# Patient Record
Sex: Female | Born: 1975 | Race: White | Hispanic: No | Marital: Single | Smoking: Never smoker
Health system: Southern US, Community
[De-identification: ages and names within clinical notes are randomized; demographics above are authoritative.]

## PROBLEM LIST (undated history)

## (undated) DIAGNOSIS — G4730 Sleep apnea, unspecified: Secondary | ICD-10-CM

## (undated) DIAGNOSIS — L409 Psoriasis, unspecified: Secondary | ICD-10-CM

## (undated) DIAGNOSIS — F410 Panic disorder [episodic paroxysmal anxiety] without agoraphobia: Secondary | ICD-10-CM

## (undated) DIAGNOSIS — M542 Cervicalgia: Secondary | ICD-10-CM

## (undated) DIAGNOSIS — J45909 Unspecified asthma, uncomplicated: Secondary | ICD-10-CM

## (undated) DIAGNOSIS — R030 Elevated blood-pressure reading, without diagnosis of hypertension: Secondary | ICD-10-CM

## (undated) DIAGNOSIS — G44209 Tension-type headache, unspecified, not intractable: Secondary | ICD-10-CM

## (undated) DIAGNOSIS — G43909 Migraine, unspecified, not intractable, without status migrainosus: Secondary | ICD-10-CM

## (undated) HISTORY — DX: Sleep apnea, unspecified: G47.30

## (undated) HISTORY — DX: Panic disorder (episodic paroxysmal anxiety): F41.0

## (undated) HISTORY — DX: Cervicalgia: M54.2

## (undated) HISTORY — DX: Elevated blood-pressure reading, without diagnosis of hypertension: R03.0

## (undated) HISTORY — DX: Migraine, unspecified, not intractable, without status migrainosus: G43.909

## (undated) HISTORY — DX: Tension-type headache, unspecified, not intractable: G44.209

## (undated) HISTORY — DX: Unspecified asthma, uncomplicated: J45.909

---

## 2007-11-02 ENCOUNTER — Observation Stay: Payer: Self-pay

## 2007-11-09 ENCOUNTER — Observation Stay: Payer: Self-pay | Admitting: Obstetrics and Gynecology

## 2007-11-11 ENCOUNTER — Observation Stay: Payer: Self-pay

## 2007-11-11 ENCOUNTER — Ambulatory Visit: Payer: Self-pay | Admitting: Obstetrics & Gynecology

## 2007-11-12 ENCOUNTER — Inpatient Hospital Stay: Payer: Self-pay

## 2009-12-01 ENCOUNTER — Ambulatory Visit: Payer: Self-pay | Admitting: Family Medicine

## 2010-07-19 ENCOUNTER — Emergency Department: Payer: Self-pay | Admitting: Unknown Physician Specialty

## 2010-10-09 ENCOUNTER — Ambulatory Visit: Payer: Self-pay | Admitting: Surgery

## 2010-12-12 ENCOUNTER — Ambulatory Visit: Payer: Self-pay | Admitting: Surgery

## 2011-01-08 ENCOUNTER — Ambulatory Visit: Payer: Self-pay | Admitting: Surgery

## 2011-04-10 ENCOUNTER — Ambulatory Visit: Payer: Self-pay | Admitting: Surgery

## 2011-05-11 ENCOUNTER — Ambulatory Visit: Payer: Self-pay | Admitting: Surgery

## 2011-06-18 ENCOUNTER — Ambulatory Visit: Payer: Self-pay | Admitting: Family Medicine

## 2011-06-20 ENCOUNTER — Encounter: Payer: Self-pay | Admitting: Family Medicine

## 2011-06-20 ENCOUNTER — Ambulatory Visit (INDEPENDENT_AMBULATORY_CARE_PROVIDER_SITE_OTHER): Payer: Commercial Managed Care - PPO | Admitting: Family Medicine

## 2011-06-20 VITALS — BP 112/80 | HR 84 | Temp 98.4°F | Ht 64.5 in | Wt 233.5 lb

## 2011-06-20 DIAGNOSIS — Z0189 Encounter for other specified special examinations: Secondary | ICD-10-CM

## 2011-06-20 DIAGNOSIS — F410 Panic disorder [episodic paroxysmal anxiety] without agoraphobia: Secondary | ICD-10-CM

## 2011-06-20 DIAGNOSIS — Z7689 Persons encountering health services in other specified circumstances: Secondary | ICD-10-CM

## 2011-06-20 DIAGNOSIS — G44209 Tension-type headache, unspecified, not intractable: Secondary | ICD-10-CM | POA: Insufficient documentation

## 2011-06-20 LAB — POCT URINE PREGNANCY: Preg Test, Ur: NEGATIVE

## 2011-06-20 MED ORDER — BACLOFEN 10 MG PO TABS: 10.0000 mg | ORAL_TABLET | Freq: Two times a day (BID) | ORAL | Status: AC | PRN

## 2011-06-20 MED ORDER — ALPRAZOLAM 0.25 MG PO TABS: 0.2500 mg | ORAL_TABLET | Freq: Every evening | ORAL | Status: AC | PRN

## 2011-06-20 NOTE — Assessment & Plan Note (Signed)
Coming from neck tension. States has not had effect with several relaxants including flexeril, methocarbamol.  Baclofen has worked well in past, will start this.

## 2011-06-20 NOTE — Patient Instructions (Addendum)
I will request records from [facility] on blood work and EKG done there and review. These sound like anxiety attacks. We will do trial of xanax to take on an as needed basis. Work on healthy ways of coping with stress. Please return if not improving. Return in 1-2 months for follow up.

## 2011-06-20 NOTE — Assessment & Plan Note (Addendum)
Reasonable to do trial xanax. Discussed temporary use of xanax, prn use.  Discussed tolerance and abusive potential of med. Palpitation episodes do sound consistent with panic attacks as improved with xanax, recent stress, age.  No fmhx arrhythmias, no LOC. Discussed I wanted to obtain blood work, EKG to work up palpitations, pt states had recent EKG and blood work and requests we start with reviewing those so have requested records. Advised if palpitations not improving on xanax will need to return. If increasing use of xanax, will need to discuss better long term management (SSRI, buspar, counseling). Discussed increased healthy coping mechanisms, pt thinks would like to start walking.  As starting xanax, Upreg today = neg.

## 2011-06-20 NOTE — Progress Notes (Signed)
Subjective:    Patient ID: Makayla Duke, female    DOB: 07/31/1976, 35 y.o.   MRN: [id number]  HPI CC: new pt, establish  Tends to get muscle spasm in neck, then causes HA.  Has gone on since child.  Has tried flexeril, methocarbamol, statss vicodin and soma and baclofen have worked well in past.  Has been told has high blood pressure in past.  Today stable.   Looked into sleeve gastrectomy.  H/o sleep apnea.  Not approved for surgery.  Panic attacks? - going on for years.  Starts with feeling nervous, worse with stress.  Heart speeds up and has felt some skipped beats, feels very nervous and scared to go outside.  Last 2 years worsening.  Recently (in last 34mo) have been getting progressively worse, getting dizzy with them, along with shortness of breath/hyperventilating and starts crying.  Had one last night while driving.  Last 30-40 min.  Now getting 3-4 days /wk.  Denies chest pain/tightness, abd pain, nausea/vomiting, diaphoresis, LOC.  Denies h/o anxiety, doesn't tend to worry.  Did take xanax which seemed to help.  H/o postpartum depression with 2nd daughter.  Treated with ?SSRI.  Denies mood issues currently.  No family hx heart issues or arrhythmias.  Not on birth control.  LMP 05/31/2011.  Recently had unprotected sex.  Preventative: Unsure tetanus. Will get at flu clinic with daughters. Thinks had CPE 09/2010.  Told Vit D was low. Well woman - pap smear 2011 normal, OBGYN.  Medications and allergies reviewed and updated in chart.  Past histories reviewed and updated if relevant as below. Patient Active Problem List  Diagnoses  . Tension headache  . Panic attacks   Past Medical History  Diagnosis Date  . Childhood asthma   . Tension headache   . Elevated blood pressure reading without diagnosis of hypertension   . Migraines   . Panic attacks    Past Surgical History  Procedure Date  . Cesarean section 2007; 2009   History  Substance Use Topics  . Smoking  status: Never Smoker   . Smokeless tobacco: Never Used  . Alcohol Use: Yes     Occasional   Family History  Problem Relation Age of Onset  . Hypertension Father   . Coronary artery disease Father 64    MI  . Arthritis Mother   . Migraines Mother   . Breast cancer Paternal Aunt   . Cancer Paternal Aunt     breast  . Lung cancer Maternal Grandfather   . Cancer Maternal Grandfather 60    lung, smoker  . Hypertension Maternal Uncle   . Diabetes    . Lung cancer Paternal Grandmother   . Cancer Paternal Grandmother 40    lung, smoker  . Mental illness Paternal Grandmother     schizophrenia  . Mental illness Paternal Uncle     bipolar  . Stroke Neg Hx    No Known Allergies No current outpatient prescriptions on file prior to visit.   Review of Systems  Constitutional: Negative for fever, chills, activity change, appetite change, fatigue and unexpected weight change.  HENT: Negative for hearing loss and neck pain.   Eyes: Negative for visual disturbance.  Respiratory: Positive for shortness of breath. Negative for cough, chest tightness and wheezing.   Cardiovascular: Positive for palpitations. Negative for chest pain and leg swelling.  Gastrointestinal: Negative for nausea, vomiting, abdominal pain, diarrhea, constipation, blood in stool and abdominal distention.  Genitourinary: Negative for hematuria and difficulty  urinating.  Musculoskeletal: Negative for myalgias and arthralgias.  Skin: Negative for rash.  Neurological: Positive for headaches. Negative for dizziness, seizures and syncope.  Hematological: Does not bruise/bleed easily.  Psychiatric/Behavioral: Negative for dysphoric mood. The patient is not nervous/anxious.        Objective:   Physical Exam  Nursing note and vitals reviewed. Constitutional: She is oriented to person, place, and time. She appears well-developed and well-nourished. No distress.  HENT:  Head: Normocephalic and atraumatic.  Right Ear:  External ear normal.  Left Ear: External ear normal.  Nose: Nose normal.  Mouth/Throat: Oropharynx is clear and moist.  Eyes: Conjunctivae and EOM are normal. Pupils are equal, round, and reactive to light.  Neck: Normal range of motion. Neck supple. No thyromegaly present.  Cardiovascular: Normal rate, regular rhythm, normal heart sounds and intact distal pulses.   No murmur heard. Pulses:      Radial pulses are 2+ on the right side, and 2+ on the left side.  Pulmonary/Chest: Effort normal and breath sounds normal. No respiratory distress. She has no wheezes. She has no rales.  Abdominal: Soft. Bowel sounds are normal. She exhibits no distension and no mass. There is no tenderness. There is no rebound and no guarding.  Musculoskeletal: Normal range of motion.  Lymphadenopathy:    She has no cervical adenopathy.  Neurological: She is alert and oriented to person, place, and time.       CN grossly intact, station and gait intact  Skin: Skin is warm and dry. No rash noted.  Psychiatric: She has a normal mood and affect. Her behavior is normal. Judgment and thought content normal.          Assessment & Plan:

## 2011-07-05 ENCOUNTER — Ambulatory Visit (INDEPENDENT_AMBULATORY_CARE_PROVIDER_SITE_OTHER): Payer: Commercial Managed Care - PPO | Admitting: Family Medicine

## 2011-07-05 ENCOUNTER — Encounter: Payer: Self-pay | Admitting: Family Medicine

## 2011-07-05 DIAGNOSIS — G43909 Migraine, unspecified, not intractable, without status migrainosus: Secondary | ICD-10-CM

## 2011-07-05 MED ORDER — SUMATRIPTAN SUCCINATE 50 MG PO TABS: ORAL_TABLET | ORAL | Status: DC

## 2011-07-05 MED ORDER — SUMATRIPTAN SUCCINATE 6 MG/0.5ML ~~LOC~~ SOLN
6.0000 mg | Freq: Once | SUBCUTANEOUS | Status: AC
  Administered 2011-07-05: 6 mg via SUBCUTANEOUS

## 2011-07-05 NOTE — Assessment & Plan Note (Addendum)
Endorses migraine similar to ones in past (not as severe as in past).  Denies improvement with flexeril, baclofen, other muscle relaxants, not improved with toradol or tramadol in past. Shot of sumatriptan today 6mg  - significant improvement after 10 minutes. Sent home with sumatriptan script today, discussed use of this med as abortive therapy. To return if migraines becoming more frequent.

## 2011-07-05 NOTE — Patient Instructions (Signed)
For migraine, shot of sumatriptan today. Sent home with sumatriptan by mouth.  May take x1, may repeat in 2 hours if headache still not relieved.  If any chest pain, please stop medicine and return to see me.  May also try benadryl at home to help you hopefully sleep off the migraine. Please notify us if not improving. I hope you start feeling better.

## 2011-07-05 NOTE — Progress Notes (Signed)
  Subjective:    Patient ID: Makayla Duke, female    DOB: [birth year], 35 y.o.   MRN: [id number]  HPI CC: migraine  4th day of migraine.  Known migraines.  This feels like prior migraine.  Started in neck, then traveled up behind right eye.  Constant sharp pain behind left eye.  Staying nauseated but no vomiting.  + photo and phonophobia.  Not relieved by excedrin migraine, excedrin PM, nyquil.  Started as tension headache from neck, then sharp pain.  Noted smell changes prior to starting headache.  Staying constant.  Thinks stress induced.  States toradol shot hasn't help in past.  Last migraine was 4 mo ago, severe sent her to hospital.  No fevers, dizziness, abd pain, chest pain.  LMP - this week, finished yesterday.  Has had menstrual associated migraines in past.  Tends to get muscle spasm in neck, then causes tension HA. Has gone on since child. Has tried flexeril, methocarbamol, states vicodin and soma and baclofen have worked well in past.  Started on baclofen this month, not really helping.  Review of Systems Per HPI    Objective:   Physical Exam  Nursing note and vitals reviewed. Constitutional: She is oriented to person, place, and time. She appears well-developed and well-nourished. No distress.  HENT:  Head: Atraumatic.  Mouth/Throat: Oropharynx is clear and moist. No oropharyngeal exudate.  Eyes: Conjunctivae and EOM are normal. Pupils are equal, round, and reactive to light. No scleral icterus.  Neck: Normal range of motion. Neck supple.  Cardiovascular: Normal rate, regular rhythm, normal heart sounds and intact distal pulses.   No murmur heard. Lymphadenopathy:    She has no cervical adenopathy.  Neurological: She is alert and oriented to person, place, and time. She has normal strength. No cranial nerve deficit or sensory deficit. Coordination and gait normal.  Reflex Scores:      Bicep reflexes are 2+ on the right side and 2+ on the left side.      Normal finger to  nose. EOMI, no nystagmus.  Skin: Skin is warm and dry. No rash noted.  Psychiatric: She has a normal mood and affect.      Assessment & Plan:

## 2011-08-31 ENCOUNTER — Emergency Department: Payer: Self-pay | Admitting: Emergency Medicine

## 2011-10-08 ENCOUNTER — Emergency Department: Payer: Self-pay | Admitting: Emergency Medicine

## 2011-10-10 ENCOUNTER — Other Ambulatory Visit: Payer: Self-pay | Admitting: Family Medicine

## 2011-11-08 ENCOUNTER — Ambulatory Visit (INDEPENDENT_AMBULATORY_CARE_PROVIDER_SITE_OTHER): Payer: BC Managed Care – PPO | Admitting: Family Medicine

## 2011-11-08 ENCOUNTER — Encounter: Payer: Self-pay | Admitting: Family Medicine

## 2011-11-08 ENCOUNTER — Telehealth: Payer: Self-pay | Admitting: Family Medicine

## 2011-11-08 DIAGNOSIS — G43909 Migraine, unspecified, not intractable, without status migrainosus: Secondary | ICD-10-CM

## 2011-11-08 MED ORDER — ONDANSETRON HCL 4 MG/2ML IJ SOLN: 4.0000 mg | Freq: Once | INTRAMUSCULAR | Status: DC

## 2011-11-08 MED ORDER — SUMATRIPTAN SUCCINATE 6 MG/0.5ML ~~LOC~~ SOLN
6.0000 mg | Freq: Once | SUBCUTANEOUS | Status: AC
  Administered 2011-11-08: 6 mg via SUBCUTANEOUS

## 2011-11-08 NOTE — Telephone Encounter (Signed)
Scheduled appt w/ Dr. Dayton Martes for headache.Marland KitchenMarland Kitchen

## 2011-11-08 NOTE — Progress Notes (Signed)
   Subjective:    Patient ID: Makayla Duke, female    DOB: [birth year], 36 y.o.   MRN: [id number]  HPI 36 yo pt of Dr. Reece Agar here for migraine.  H/o migraines- last required IM imitrex to relieve migraine.  This feels like prior migraine.  Started in neck, then traveled up behind left eye.  Constant sharp pain behind left eye.  Nausea, no vomiting.  + photo and phonophobia.  Not relieved by excedrin migraine, excedrin PM.  Out of her oral imitrex.    Last migraine was 4 mo ago, responded well to IM imitrex.  Often so severe that she has to go to the hospital.    No fevers, dizziness, abd pain, chest pain.  LMP last week, seems to be getting them at least once a month now.   Review of Systems Per HPI    Objective:   Physical Exam  BP 130/80  Pulse 76  Temp(Src) 98.1 F (36.7 C) (Oral)  Wt 229 lb (103.874 kg)  LMP 11/04/2011  Nursing note and vitals reviewed. Constitutional: She is oriented to person, place, and time. She appears well-developed and well-nourished. No distress.  HENT:  Head: Atraumatic. Pos photophobia Mouth/Throat: Oropharynx is clear and moist. No oropharyngeal exudate.  Eyes: Conjunctivae and EOM are normal. Pupils are equal, round, and reactive to light. No scleral icterus.  Neck: Normal range of motion. Neck supple.  Cardiovascular: Normal rate, regular rhythm, normal heart sounds and intact distal pulses.   No murmur heard. Lymphadenopathy:    She has no cervical adenopathy.  Neurological: She is alert and oriented to person, place, and time. She has normal strength. No cranial nerve deficit or sensory deficit. Coordination and gait normal.  Skin: Skin is warm and dry. No rash noted.  Psychiatric: She has a normal mood and affect.      Assessment & Plan:   1. Migraine    New- given IM imitrex and po Zofran given in office. Will refill her oral imitrex. Advised to follow up with Dr. Reece Agar to see if she needs prophylaxis as they are starting to occur more  frequently.

## 2011-12-04 ENCOUNTER — Other Ambulatory Visit: Payer: Self-pay | Admitting: Family Medicine

## 2012-01-20 ENCOUNTER — Encounter: Payer: Self-pay | Admitting: Family Medicine

## 2012-01-22 ENCOUNTER — Encounter: Payer: Self-pay | Admitting: Family Medicine

## 2012-01-22 ENCOUNTER — Ambulatory Visit (INDEPENDENT_AMBULATORY_CARE_PROVIDER_SITE_OTHER): Payer: BC Managed Care – PPO | Admitting: Family Medicine

## 2012-01-22 VITALS — BP 104/78 | HR 72 | Temp 98.0°F | Wt 221.0 lb

## 2012-01-22 DIAGNOSIS — R300 Dysuria: Secondary | ICD-10-CM

## 2012-01-22 MED ORDER — CIPROFLOXACIN HCL 500 MG PO TABS: 500.0000 mg | ORAL_TABLET | Freq: Two times a day (BID) | ORAL | Status: DC

## 2012-01-22 NOTE — Progress Notes (Signed)
SUBJECTIVE: Makayla Duke is a 36 y.o. female who complains of urinary frequency, urgency and dysuria x 3 days.  She is having left sided flank pain without fever, chills, or abnormal vaginal discharge or bleeding.   She is requesting something "strong" for pain.  Took AZO this am, not sufficient in controlling her pain.  Patient Active Problem List  Diagnoses  . Tension headache  . Panic attacks  . Migraine   Past Medical History  Diagnosis Date  . Childhood asthma   . Tension headache   . Elevated blood pressure reading without diagnosis of hypertension   . Migraines   . Panic attacks   . Sleep apnea   . Cervicalgia     Dion Saucier)   Past Surgical History  Procedure Date  . Cesarean section 2007; 2009   History  Substance Use Topics  . Smoking status: Never Smoker   . Smokeless tobacco: Never Used  . Alcohol Use: Yes     Occasional   Family History  Problem Relation Age of Onset  . Hypertension Father   . Coronary artery disease Father 69    MI  . Arthritis Mother   . Migraines Mother   . Breast cancer Paternal Aunt   . Cancer Paternal Aunt     breast  . Lung cancer Maternal Grandfather   . Cancer Maternal Grandfather 60    lung, smoker  . Hypertension Maternal Uncle   . Diabetes    . Lung cancer Paternal Grandmother   . Cancer Paternal Grandmother 40    lung, smoker  . Mental illness Paternal Grandmother     schizophrenia  . Mental illness Paternal Uncle     bipolar  . Stroke Neg Hx    No Known Allergies Current Outpatient Prescriptions on File Prior to Visit  Medication Sig Dispense Refill  . ibuprofen (ADVIL,MOTRIN) 200 MG tablet Take 200 mg by mouth as needed.        . SUMAtriptan (IMITREX) 50 MG tablet TAKE AS DIRECTED  9 tablet  1   The PMH, PSH, Social History, Family History, Medications, and allergies have been reviewed in [facility], and have been updated if relevant.  OBJECTIVE:  Wt 221 lb (100.245 kg) BP 104/78  Pulse 72  Temp(Src) 98 F  (36.7 C) (Oral)  Wt 221 lb (100.245 kg)  Appears well, in no apparent distress.  Vital signs are normal. The abdomen is soft without tenderness, guarding, mass, rebound or organomegaly. No CVA tenderness or inguinal adenopathy noted. Urine dipstick shows unable to read due to AZP   ASSESSMENT: UTI uncomplicated without evidence of pyelonephritis  PLAN: Treatment per orders cipro 500 mg twice daily x 3 days- send urine for cx, also push fluids, may use Pyridium OTC prn. Call or return to clinic prn if these symptoms worsen or fail to improve as anticipated. Pt was quite upset when I explained that narcotics are not indicated in treatment of UTI and to continue pyridium.

## 2012-01-31 ENCOUNTER — Encounter: Payer: Self-pay | Admitting: Family Medicine

## 2012-01-31 ENCOUNTER — Other Ambulatory Visit: Payer: Self-pay | Admitting: Family Medicine

## 2012-01-31 ENCOUNTER — Ambulatory Visit (INDEPENDENT_AMBULATORY_CARE_PROVIDER_SITE_OTHER): Payer: BC Managed Care – PPO | Admitting: Family Medicine

## 2012-01-31 DIAGNOSIS — R110 Nausea: Secondary | ICD-10-CM

## 2012-01-31 DIAGNOSIS — G43909 Migraine, unspecified, not intractable, without status migrainosus: Secondary | ICD-10-CM

## 2012-01-31 MED ORDER — SUMATRIPTAN SUCCINATE 6 MG/0.5ML ~~LOC~~ SOLN
6.0000 mg | Freq: Once | SUBCUTANEOUS | Status: AC
  Administered 2012-01-31: 6 mg via SUBCUTANEOUS

## 2012-01-31 MED ORDER — ONDANSETRON 4 MG PO TBDP
4.0000 mg | ORAL_TABLET | Freq: Once | ORAL | Status: AC
  Administered 2012-01-31: 4 mg via ORAL

## 2012-01-31 MED ORDER — SUMATRIPTAN SUCCINATE 50 MG PO TABS: 50.0000 mg | ORAL_TABLET | Freq: Every day | ORAL | Status: DC | PRN

## 2012-01-31 NOTE — Patient Instructions (Signed)
Shot of sumatriptan and some zofran for nausea today. I've refilled sumatriptan oral for you.  If losing effectiveness or if having more than 2-3 migraines per week, please return to discuss daily medicine to help prevent migraines. Good to see you today, call us with questions.

## 2012-01-31 NOTE — Progress Notes (Signed)
  Subjective:    Patient ID: Makayla Duke, female    DOB: Dec 18, 1975, 36 y.o.   MRN: [id number]  HPI CC: headache  36 yo with known migraines.  Headache started at 4pm, started different than typical migraine as it was more rapid onset than prior, then at 11pm had migraine type pain associated with nausea, photo and phono phobia.  Marked smells make worse as well.  Several emesis x6-7, NBNB.  Left sided frontal pain - constant stabbing pain.  Currently with typical migraine pain.  Tried tylenol, ibuprofen.  Ran out of imitrex 11/2011.  Prescribed 10 pills on 11/2011, went through them in 1 week then migraines improved.  Then today started having HA again.  Had not had headache for 1 1/2 months.  Migraines in past so severe that she has had to go to the hospital.  No fevers, dizziness, abd pain, chest pain. LMP 3 wks ago, h/o menstrual associated migraines but those usually occur right after period.  Has denied improvement with flexeril, baclofen, other muscle relaxants, not improved with toradol or tramadol in past.  Resolved with shot of sumatriptan in past.  Wt Readings from Last 3 Encounters:  01/31/12 218 lb 4 oz (98.998 kg)  01/22/12 221 lb (100.245 kg)  11/08/11 229 lb (103.874 kg)     Review of Systems Per HPI    Objective:   Physical Exam  Nursing note and vitals reviewed. Constitutional: She is oriented to person, place, and time. She appears well-developed and well-nourished. No distress.  HENT:  Head: Normocephalic and atraumatic.  Right Ear: External ear normal.  Left Ear: External ear normal.  Mouth/Throat: Oropharynx is clear and moist. No oropharyngeal exudate.  Eyes: Conjunctivae and EOM are normal. Pupils are equal, round, and reactive to light. No scleral icterus.  Neck: Normal range of motion. Neck supple.  Lymphadenopathy:    She has no cervical adenopathy.  Neurological: She is alert and oriented to person, place, and time. No cranial nerve deficit.  Coordination and gait normal.       Photo/phonophobia present  Skin: Skin is warm and dry. No rash noted.  Psychiatric: She has a normal mood and affect.   zofran for nausea and sumatriptan shot 6mg  IM for headache.  Stayed some nauseated, but headache did improve after triptan administration.    Assessment & Plan:

## 2012-02-01 NOTE — Assessment & Plan Note (Addendum)
She has been using triptans too much which was discussed.  However migraines did improve for 1.5 mo prior to presentation yesterday of new migraine. Nonfocal neurological exam. Provided with shot of sumatriptan and zofran ODT 4mg . States has to take 2 triptan pills at once but hesitant to increase triptan dose to 100mg  as has used too much of abortive med in past. Refilled 50mg  today #10, discussed if having to use 3 or more times in 1 week really needs to return to discuss ppx med. Consider changing triptan therapy in future. Did not start ppx med today as had been migraine free for last 1.5 mo. Offered work note, pt declined.

## 2012-06-03 ENCOUNTER — Emergency Department: Payer: Self-pay | Admitting: *Deleted

## 2012-08-14 ENCOUNTER — Emergency Department: Payer: Self-pay | Admitting: Emergency Medicine

## 2012-10-25 ENCOUNTER — Emergency Department: Payer: Self-pay | Admitting: Emergency Medicine

## 2013-01-05 ENCOUNTER — Emergency Department: Payer: Self-pay | Admitting: Unknown Physician Specialty

## 2013-02-06 ENCOUNTER — Other Ambulatory Visit: Payer: Self-pay | Admitting: Family Medicine

## 2013-03-18 ENCOUNTER — Emergency Department: Payer: Self-pay | Admitting: Emergency Medicine

## 2013-03-22 LAB — BETA STREP CULTURE(ARMC)

## 2013-07-22 ENCOUNTER — Emergency Department: Payer: Self-pay | Admitting: Emergency Medicine

## 2013-08-01 ENCOUNTER — Emergency Department: Payer: Self-pay | Admitting: Emergency Medicine

## 2013-08-01 LAB — COMPREHENSIVE METABOLIC PANEL
Albumin: 4.5 g/dL (ref 3.4–5.0)
Alkaline Phosphatase: 77 U/L
Anion Gap: 8 (ref 7–16)
BUN: 12 mg/dL (ref 7–18)
Calcium, Total: 9.5 mg/dL (ref 8.5–10.1)
Chloride: 104 mmol/L (ref 98–107)
Co2: 25 mmol/L (ref 21–32)
Creatinine: 0.74 mg/dL (ref 0.60–1.30)
EGFR (Non-African Amer.): >60
Potassium: 3.6 mmol/L (ref 3.5–5.1)
SGOT(AST): 22 U/L (ref 15–37)
Sodium: 137 mmol/L (ref 136–145)
Total Protein: 8.3 g/dL — ABNORMAL HIGH (ref 6.4–8.2)

## 2013-08-01 LAB — CBC
HGB: 15.4 g/dL (ref 12.0–16.0)
MCH: 28.1 pg (ref 26.0–34.0)
MCV: 83 fL (ref 80–100)
Platelet: 245 10*3/uL (ref 150–440)
RBC: 5.47 10*6/uL — ABNORMAL HIGH (ref 3.80–5.20)
RDW: 12.5 % (ref 11.5–14.5)
WBC: 12.9 10*3/uL — ABNORMAL HIGH (ref 3.6–11.0)

## 2013-08-01 LAB — LIPASE, BLOOD: Lipase: 64 U/L — ABNORMAL LOW (ref 73–393)

## 2013-08-02 LAB — PREGNANCY, URINE: Pregnancy Test, Urine: NEGATIVE m[IU]/mL

## 2013-08-02 LAB — URINALYSIS, COMPLETE
Bacteria: NONE SEEN
Bilirubin,UR: NEGATIVE
Blood: NEGATIVE
Glucose,UR: NEGATIVE mg/dL (ref 0–75)
Leukocyte Esterase: NEGATIVE
Nitrite: NEGATIVE
Protein: 100
RBC,UR: 2 /HPF (ref 0–5)

## 2013-10-14 ENCOUNTER — Emergency Department: Payer: Self-pay | Admitting: Emergency Medicine

## 2013-11-14 ENCOUNTER — Emergency Department: Payer: Self-pay | Admitting: Emergency Medicine

## 2013-11-14 LAB — URINALYSIS, COMPLETE
BLOOD: NEGATIVE
Bilirubin,UR: NEGATIVE
Glucose,UR: NEGATIVE mg/dL (ref 0–75)
Nitrite: NEGATIVE
Ph: 5 (ref 4.5–8.0)
SPECIFIC GRAVITY: 1.026 (ref 1.003–1.030)
Squamous Epithelial: 13
WBC UR: 91 /HPF (ref 0–5)

## 2013-11-14 LAB — WET PREP, GENITAL

## 2013-11-14 LAB — GC/CHLAMYDIA PROBE AMP

## 2013-11-14 LAB — PREGNANCY, URINE: Pregnancy Test, Urine: NEGATIVE m[IU]/mL

## 2013-11-16 LAB — URINE CULTURE

## 2013-11-17 LAB — BETA STREP CULTURE(ARMC)

## 2013-12-12 ENCOUNTER — Inpatient Hospital Stay (HOSPITAL_COMMUNITY)
Admission: AD | Admit: 2013-12-12 | Discharge: 2013-12-12 | Disposition: A | Discharge status: Home / Self Care | Payer: Self-pay | Source: Ambulatory Visit | Attending: Obstetrics and Gynecology | Admitting: Obstetrics and Gynecology

## 2013-12-12 ENCOUNTER — Encounter (HOSPITAL_COMMUNITY): Payer: Self-pay | Admitting: *Deleted

## 2013-12-12 DIAGNOSIS — G4730 Sleep apnea, unspecified: Secondary | ICD-10-CM | POA: Insufficient documentation

## 2013-12-12 DIAGNOSIS — R350 Frequency of micturition: Secondary | ICD-10-CM | POA: Insufficient documentation

## 2013-12-12 DIAGNOSIS — B372 Candidiasis of skin and nail: Secondary | ICD-10-CM | POA: Insufficient documentation

## 2013-12-12 DIAGNOSIS — R319 Hematuria, unspecified: Secondary | ICD-10-CM | POA: Insufficient documentation

## 2013-12-12 DIAGNOSIS — B373 Candidiasis of vulva and vagina: Secondary | ICD-10-CM | POA: Insufficient documentation

## 2013-12-12 DIAGNOSIS — G43909 Migraine, unspecified, not intractable, without status migrainosus: Secondary | ICD-10-CM | POA: Insufficient documentation

## 2013-12-12 DIAGNOSIS — Z8249 Family history of ischemic heart disease and other diseases of the circulatory system: Secondary | ICD-10-CM | POA: Insufficient documentation

## 2013-12-12 DIAGNOSIS — N390 Urinary tract infection, site not specified: Secondary | ICD-10-CM

## 2013-12-12 DIAGNOSIS — B3731 Acute candidiasis of vulva and vagina: Secondary | ICD-10-CM | POA: Insufficient documentation

## 2013-12-12 DIAGNOSIS — F410 Panic disorder [episodic paroxysmal anxiety] without agoraphobia: Secondary | ICD-10-CM | POA: Insufficient documentation

## 2013-12-12 DIAGNOSIS — R3915 Urgency of urination: Secondary | ICD-10-CM | POA: Insufficient documentation

## 2013-12-12 LAB — URINALYSIS, ROUTINE W REFLEX MICROSCOPIC
BILIRUBIN URINE: NEGATIVE
Glucose, UA: 100 mg/dL — AB
KETONES UR: NEGATIVE mg/dL
NITRITE: POSITIVE — AB
PH: 6 (ref 5.0–8.0)
Protein, ur: 30 mg/dL — AB
Specific Gravity, Urine: 1.02 (ref 1.005–1.030)
Urobilinogen, UA: 1 mg/dL (ref 0.0–1.0)

## 2013-12-12 LAB — WET PREP, GENITAL
Clue Cells Wet Prep HPF POC: NONE SEEN
TRICH WET PREP: NONE SEEN

## 2013-12-12 LAB — HIV ANTIBODY (ROUTINE TESTING W REFLEX): HIV: NONREACTIVE

## 2013-12-12 LAB — CBC
HEMATOCRIT: 37.7 % (ref 36.0–46.0)
HEMOGLOBIN: 12.8 g/dL (ref 12.0–15.0)
MCH: 28.4 pg (ref 26.0–34.0)
MCHC: 34 g/dL (ref 30.0–36.0)
MCV: 83.8 fL (ref 78.0–100.0)
Platelets: 194 10*3/uL (ref 150–400)
RBC: 4.5 MIL/uL (ref 3.87–5.11)
RDW: 13.2 % (ref 11.5–15.5)
WBC: 8.2 10*3/uL (ref 4.0–10.5)

## 2013-12-12 LAB — URINE MICROSCOPIC-ADD ON

## 2013-12-12 LAB — POCT PREGNANCY, URINE: Preg Test, Ur: NEGATIVE

## 2013-12-12 MED ORDER — FLUCONAZOLE 150 MG PO TABS: 150.0000 mg | ORAL_TABLET | Freq: Every day | ORAL | Status: DC

## 2013-12-12 MED ORDER — CIPROFLOXACIN HCL 250 MG PO TABS: 250.0000 mg | ORAL_TABLET | Freq: Two times a day (BID) | ORAL | Status: DC

## 2013-12-12 MED ORDER — FLUCONAZOLE 150 MG PO TABS
150.0000 mg | ORAL_TABLET | Freq: Once | ORAL | Status: AC
  Administered 2013-12-12: 150 mg via ORAL
  Filled 2013-12-12: qty 1

## 2013-12-12 MED ORDER — PHENAZOPYRIDINE HCL 200 MG PO TABS: 200.0000 mg | ORAL_TABLET | Freq: Three times a day (TID) | ORAL | Status: DC

## 2013-12-12 NOTE — MAU Provider Note (Signed)
History     CSN: [id number]  Arrival date and time: 12/12/13 1322   First Provider Initiated Contact with Patient 12/12/13 1359      Chief Complaint  Patient presents with  . Urinary Tract Infection   HPI Ms. Cloyd StagersBrandi Wigen is a 38 y.o. [id number] who presents to MAU today with complaint of dysuria, hematuria, urinary frequency and urgency. The patient has also developed right sided flank pain since last night. She denies fever, vaginal discharge today. She is concerned about STDs as she had unprotected sex ~ 2 weeks ago and then found out her partner has been cheating on her. The patient also reports recently being seen at [facility] and being tested for HSV because of "sores" on her rectum and genital area. She was told they would call her if positive. She is using Nystatin cream on the rash now that she feels is helping. She was also treated for UTI, BV and yeast at that time.   OB History   Grav Para Term Preterm Abortions TAB SAB Ect Mult Living   2 2 1 1      2       Past Medical History  Diagnosis Date  . Childhood asthma   . Tension headache   . Elevated blood pressure reading without diagnosis of hypertension   . Migraines   . Panic attacks   . Sleep apnea   . Cervicalgia     Dion Saucier([place])    Past Surgical History  Procedure Laterality Date  . Cesarean section  2007; 2009    Family History  Problem Relation Age of Onset  . Hypertension Father   . Coronary artery disease Father 7051    MI  . Arthritis Mother   . Migraines Mother   . Breast cancer Paternal Aunt   . Cancer Paternal Aunt     breast  . Lung cancer Maternal Grandfather   . Cancer Maternal Grandfather 60    lung, smoker  . Hypertension Maternal Uncle   . Diabetes    . Lung cancer Paternal Grandmother   . Cancer Paternal Grandmother 40    lung, smoker  . Mental illness Paternal Grandmother     schizophrenia  . Mental illness Paternal Uncle     bipolar  . Stroke Neg Hx     History  Substance Use  Topics  . Smoking status: Never Smoker   . Smokeless tobacco: Never Used  . Alcohol Use: Yes     Comment: Occasional    Allergies: No Known Allergies  Prescriptions prior to admission  Medication Sig Dispense Refill  . ibuprofen (ADVIL,MOTRIN) 200 MG tablet Take 800 mg by mouth as needed.       . [DISCONTINUED] Phenazopyridine HCl (AZO TABS PO) Take 2 tablets by mouth 3 (three) times daily.      . SUMAtriptan (IMITREX) 50 MG tablet TAKE AS DIRECTED  10 tablet  3    Review of Systems  Constitutional: Negative for fever and malaise/fatigue.  Gastrointestinal: Positive for nausea and abdominal pain. Negative for vomiting.  Genitourinary: Positive for dysuria, urgency, frequency, hematuria and flank pain.       Neg - vaginal bleeding, discharge   Physical Exam   Blood pressure 120/81, pulse 88, temperature 98 F (36.7 C), temperature source Oral, resp. rate 18, height 5\' 4"  (1.626 m), weight 86.183 kg (190 lb), last menstrual period 11/24/2013.  Physical Exam  Constitutional: She is oriented to person, place, and time. She appears well-developed and well-nourished. No  distress.  HENT:  Head: Normocephalic and atraumatic.  Cardiovascular: Normal rate.   Respiratory: Effort normal.  GI: Soft. She exhibits no distension and no mass. There is tenderness (mild tenderness to palpation of the lower abdomen at midline). There is CVA tenderness (very mild, right side). There is no rebound and no guarding.  Genitourinary:    Uterus is tender. Uterus is not enlarged. Cervix exhibits no motion tenderness, no discharge and no friability. Right adnexum displays no mass and no tenderness. Left adnexum displays no mass and no tenderness. No bleeding around the vagina. Vaginal discharge (small amount of thick, white-yellow discharge noted) found.  Neurological: She is alert and oriented to person, place, and time.  Skin: Skin is warm and dry. No erythema.  Psychiatric: She has a normal mood and  affect.   Results for orders placed during the hospital encounter of 12/12/13 (from the past 24 hour(s))  URINALYSIS, ROUTINE W REFLEX MICROSCOPIC     Status: Abnormal   Collection Time    12/12/13  1:30 PM      Result Value Ref Range   Color, Urine ORANGE (*) YELLOW   APPearance HAZY (*) CLEAR   Specific Gravity, Urine 1.020  1.005 - 1.030   pH 6.0  5.0 - 8.0   Glucose, UA 100 (*) NEGATIVE mg/dL   Hgb urine dipstick TRACE (*) NEGATIVE   Bilirubin Urine NEGATIVE  NEGATIVE   Ketones, ur NEGATIVE  NEGATIVE mg/dL   Protein, ur 30 (*) NEGATIVE mg/dL   Urobilinogen, UA 1.0  0.0 - 1.0 mg/dL   Nitrite POSITIVE (*) NEGATIVE   Leukocytes, UA SMALL (*) NEGATIVE  URINE MICROSCOPIC-ADD ON     Status: Abnormal   Collection Time    12/12/13  1:30 PM      Result Value Ref Range   Squamous Epithelial / LPF FEW (*) RARE   WBC, UA 11-20  <3 WBC/hpf   RBC / HPF 0-2  <3 RBC/hpf   Bacteria, UA MANY (*) RARE  POCT PREGNANCY, URINE     Status: None   Collection Time    12/12/13  1:42 PM      Result Value Ref Range   Preg Test, Ur NEGATIVE  NEGATIVE  WET PREP, GENITAL     Status: Abnormal   Collection Time    12/12/13  2:02 PM      Result Value Ref Range   Yeast Wet Prep HPF POC FEW (*) NONE SEEN   Trich, Wet Prep NONE SEEN  NONE SEEN   Clue Cells Wet Prep HPF POC NONE SEEN  NONE SEEN   WBC, Wet Prep HPF POC FEW (*) NONE SEEN  CBC     Status: None   Collection Time    12/12/13  2:20 PM      Result Value Ref Range   WBC 8.2  4.0 - 10.5 K/uL   RBC 4.50  3.87 - 5.11 MIL/uL   Hemoglobin 12.8  12.0 - 15.0 g/dL   HCT 40.9  81.1 - 91.4 %   MCV 83.8  78.0 - 100.0 fL   MCH 28.4  26.0 - 34.0 pg   MCHC 34.0  30.0 - 36.0 g/dL   RDW 78.2  95.6 - 21.3 %   Platelets 194  150 - 400 K/uL    MAU Course  Procedures None  MDM UPT - negative UA, wet prep, GC/Chlamydia, HIV and CBC today 150 mg Diflucan given in MAU Patient is afebrile, very mild CVA tenderness  and WBCs WNL. Early pyelonephritis is  possible. Will treat x 7 days.  Assessment and Plan  A: Yeast vulvovaginitis UTI Cutaneous candidiasis  P: Discharge home Rx for Cipro, Pyridium and Diflucan given to patient Urine cultures, GC/Chlamydia and HIV pending Patient advised to continue Nystatin cream daily Discussed warning signs for Pyelonephritis  Patient may return to MAU as needed or if her condition were to change or worsen  Freddi Starr, PA-C  12/12/2013, 2:38 PM

## 2013-12-12 NOTE — MAU Note (Signed)
Pt states pain and burning for past week. Has increased hydration and taken azo, and is now having r flank pain since last pm. Blood in urine today also.

## 2013-12-12 NOTE — Discharge Instructions (Signed)
Candidal Vulvovaginitis Candidal vulvovaginitis is an infection of the vagina and vulva. The vulva is the skin around the opening of the vagina. This may cause itching and discomfort in and around the vagina.  HOME CARE  Only take medicine as told by your doctor.  Do not have sex (intercourse) until the infection is healed or as told by your doctor.  Practice safe sex.  Tell your sex partner about your infection.  Do not douche or use tampons.  Wear cotton underwear. Do not wear tight pants or panty hose.  Eat yogurt. This may help treat and prevent yeast infections. GET HELP RIGHT AWAY IF:   You have a fever.  Your problems get worse during treatment or do not get better in 3 days.  You have discomfort, irritation, or itching in your vagina or vulva area.  You have pain after sex.  You start to get belly (abdominal) pain. MAKE SURE YOU:  Understand these instructions.  Will watch your condition.  Will get help right away if you are not doing well or get worse. Document Released: 11/22/2008 Document Revised: 11/18/2011 Document Reviewed: 11/22/2008 [facility] Patient Information 2014 [place], [place]. Pyelonephritis, Adult Pyelonephritis is a kidney infection. In general, there are 2 main types of pyelonephritis:  Infections that come on quickly without any warning (acute pyelonephritis).  Infections that persist for a long period of time (chronic pyelonephritis). CAUSES  Two main causes of pyelonephritis are:  Bacteria traveling from the bladder to the kidney. This is a problem especially in pregnant women. The urine in the bladder can become filled with bacteria from multiple causes, including:  Inflammation of the prostate gland (prostatitis).  Sexual intercourse in females.  Bladder infection (cystitis).  Bacteria traveling from the bloodstream to the tissue part of the kidney. Problems that may increase your risk of getting a kidney infection  include:  Diabetes.  Kidney stones or bladder stones.  Cancer.  Catheters placed in the bladder.  Other abnormalities of the kidney or ureter. SYMPTOMS   Abdominal pain.  Pain in the side or flank area.  Fever.  Chills.  Upset stomach.  Blood in the urine (dark urine).  Frequent urination.  Strong or persistent urge to urinate.  Burning or stinging when urinating. DIAGNOSIS  Your caregiver may diagnose your kidney infection based on your symptoms. A urine sample may also be taken. TREATMENT  In general, treatment depends on how severe the infection is.   If the infection is mild and caught early, your caregiver may treat you with oral antibiotics and send you home.  If the infection is more severe, the bacteria may have gotten into the bloodstream. This will require intravenous (IV) antibiotics and a hospital stay. Symptoms may include:  High fever.  Severe flank pain.  Shaking chills.  Even after a hospital stay, your caregiver may require you to be on oral antibiotics for a period of time.  Other treatments may be required depending upon the cause of the infection. HOME CARE INSTRUCTIONS   Take your antibiotics as directed. Finish them even if you start to feel better.  Make an appointment to have your urine checked to make sure the infection is gone.  Drink enough fluids to keep your urine clear or pale yellow.  Take medicines for the bladder if you have urgency and frequency of urination as directed by your caregiver. SEEK IMMEDIATE MEDICAL CARE IF:   You have a fever or persistent symptoms for more than 2-3 days.  You  have a fever and your symptoms suddenly get worse.  You are unable to take your antibiotics or fluids.  You develop shaking chills.  You experience extreme weakness or fainting.  There is no improvement after 2 days of treatment. MAKE SURE YOU:  Understand these instructions.  Will watch your condition.  Will get help  right away if you are not doing well or get worse. Document Released: 08/26/2005 Document Revised: 02/25/2012 Document Reviewed: 01/30/2011 [facility] Patient Information 2014 [place], [place]. Urinary Tract Infection Urinary tract infections (UTIs) can develop anywhere along your urinary tract. Your urinary tract is your body's drainage system for removing wastes and extra water. Your urinary tract includes two kidneys, two ureters, a bladder, and a urethra. Your kidneys are a pair of bean-shaped organs. Each kidney is about the size of your fist. They are located below your ribs, one on each side of your spine. CAUSES Infections are caused by microbes, which are microscopic organisms, including fungi, viruses, and bacteria. These organisms are so small that they can only be seen through a microscope. Bacteria are the microbes that most commonly cause UTIs. SYMPTOMS  Symptoms of UTIs may vary by age and gender of the patient and by the location of the infection. Symptoms in young women typically include a frequent and intense urge to urinate and a painful, burning feeling in the bladder or urethra during urination. Older women and men are more likely to be tired, shaky, and weak and have muscle aches and abdominal pain. A fever may mean the infection is in your kidneys. Other symptoms of a kidney infection include pain in your back or sides below the ribs, nausea, and vomiting. DIAGNOSIS To diagnose a UTI, your caregiver will ask you about your symptoms. Your caregiver also will ask to provide a urine sample. The urine sample will be tested for bacteria and white blood cells. White blood cells are made by your body to help fight infection. TREATMENT  Typically, UTIs can be treated with medication. Because most UTIs are caused by a bacterial infection, they usually can be treated with the use of antibiotics. The choice of antibiotic and length of treatment depend on your symptoms and the type of bacteria  causing your infection. HOME CARE INSTRUCTIONS  If you were prescribed antibiotics, take them exactly as your caregiver instructs you. Finish the medication even if you feel better after you have only taken some of the medication.  Drink enough water and fluids to keep your urine clear or pale yellow.  Avoid caffeine, tea, and carbonated beverages. They tend to irritate your bladder.  Empty your bladder often. Avoid holding urine for long periods of time.  Empty your bladder before and after sexual intercourse.  After a bowel movement, women should cleanse from front to back. Use each tissue only once. SEEK MEDICAL CARE IF:   You have back pain.  You develop a fever.  Your symptoms do not begin to resolve within 3 days. SEEK IMMEDIATE MEDICAL CARE IF:   You have severe back pain or lower abdominal pain.  You develop chills.  You have nausea or vomiting.  You have continued burning or discomfort with urination. MAKE SURE YOU:   Understand these instructions.  Will watch your condition.  Will get help right away if you are not doing well or get worse. Document Released: 06/05/2005 Document Revised: 02/25/2012 Document Reviewed: 10/04/2011 [facility] Patient Information 2014 [place], [place].

## 2013-12-13 LAB — GC/CHLAMYDIA PROBE AMP
CT Probe RNA: NEGATIVE
GC Probe RNA: NEGATIVE

## 2013-12-14 NOTE — [facility] Provider Note (Signed)
`````  Attestation of Attending Supervision of Advanced Practitioner: Evaluation and management procedures were performed by the PA/NP/CNM/OB Fellow under my supervision/collaboration. Chart reviewed and agree with management and plan.  Ayeza Therriault V 12/14/2013 10:56 PM    

## 2013-12-15 LAB — URINE CULTURE: Special Requests: NORMAL

## 2014-02-09 ENCOUNTER — Other Ambulatory Visit: Payer: Self-pay | Admitting: Family Medicine

## 2014-02-09 NOTE — Telephone Encounter (Signed)
Ok to refill? Has not been seen in over 2 years. No upcoming appt.

## 2014-07-11 ENCOUNTER — Encounter (HOSPITAL_COMMUNITY): Payer: Self-pay | Admitting: *Deleted

## 2014-08-08 ENCOUNTER — Emergency Department: Payer: Self-pay | Admitting: Emergency Medicine

## 2014-08-10 ENCOUNTER — Emergency Department: Payer: Self-pay | Admitting: Emergency Medicine

## 2014-09-15 ENCOUNTER — Other Ambulatory Visit: Payer: Self-pay | Admitting: Family Medicine

## 2014-09-20 ENCOUNTER — Other Ambulatory Visit (HOSPITAL_COMMUNITY)
Admission: RE | Admit: 2014-09-20 | Discharge: 2014-09-20 | Disposition: A | Discharge status: Home / Self Care | Payer: Medicaid Other | Source: Ambulatory Visit | Attending: Family Medicine | Admitting: Family Medicine

## 2014-09-20 ENCOUNTER — Encounter: Payer: Self-pay | Admitting: Family Medicine

## 2014-09-20 ENCOUNTER — Ambulatory Visit (INDEPENDENT_AMBULATORY_CARE_PROVIDER_SITE_OTHER): Payer: Medicaid Other | Admitting: Family Medicine

## 2014-09-20 VITALS — BP 123/94 | HR 81 | Ht 64.0 in | Wt 214.6 lb

## 2014-09-20 DIAGNOSIS — L409 Psoriasis, unspecified: Secondary | ICD-10-CM | POA: Insufficient documentation

## 2014-09-20 DIAGNOSIS — Z23 Encounter for immunization: Secondary | ICD-10-CM

## 2014-09-20 DIAGNOSIS — Z124 Encounter for screening for malignant neoplasm of cervix: Secondary | ICD-10-CM

## 2014-09-20 DIAGNOSIS — Z1151 Encounter for screening for human papillomavirus (HPV): Secondary | ICD-10-CM | POA: Insufficient documentation

## 2014-09-20 DIAGNOSIS — Z01419 Encounter for gynecological examination (general) (routine) without abnormal findings: Secondary | ICD-10-CM

## 2014-09-20 DIAGNOSIS — R87810 Cervical high risk human papillomavirus (HPV) DNA test positive: Secondary | ICD-10-CM | POA: Diagnosis present

## 2014-09-20 MED ORDER — TRIAMCINOLONE ACETONIDE 0.025 % EX OINT: 1.0000 "application " | TOPICAL_OINTMENT | Freq: Two times a day (BID) | CUTANEOUS | Status: DC

## 2014-09-20 NOTE — Assessment & Plan Note (Signed)
To f/u with her dermatolgist for treatment.

## 2014-09-20 NOTE — Patient Instructions (Addendum)
Preventive Care for Adults A healthy lifestyle and preventive care can promote health and wellness. Preventive health guidelines for women include the following key practices.  A routine yearly physical is a good way to check with your health care provider about your health and preventive screening. It is a chance to share any concerns and updates on your health and to receive a thorough exam.  Visit your dentist for a routine exam and preventive care every 6 months. Brush your teeth twice a day and floss once a day. Good oral hygiene prevents tooth decay and gum disease.  The frequency of eye exams is based on your age, health, family medical history, use of contact lenses, and other factors. Follow your health care provider's recommendations for frequency of eye exams.  Eat a healthy diet. Foods like vegetables, fruits, whole grains, low-fat dairy products, and lean protein foods contain the nutrients you need without too many calories. Decrease your intake of foods high in solid fats, added sugars, and salt. Eat the right amount of calories for you.Get information about a proper diet from your health care provider, if necessary.  Regular physical exercise is one of the most important things you can do for your health. Most adults should get at least 150 minutes of moderate-intensity exercise (any activity that increases your heart rate and causes you to sweat) each week. In addition, most adults need muscle-strengthening exercises on 2 or more days a week.  Maintain a healthy weight. The body mass index (BMI) is a screening tool to identify possible weight problems. It provides an estimate of body fat based on height and weight. Your health care provider can find your BMI and can help you achieve or maintain a healthy weight.For adults 20 years and older:  A BMI below 18.5 is considered underweight.  A BMI of 18.5 to 24.9 is normal.  A BMI of 25 to 29.9 is considered overweight.  A BMI of  30 and above is considered obese.  Maintain normal blood lipids and cholesterol levels by exercising and minimizing your intake of saturated fat. Eat a balanced diet with plenty of fruit and vegetables. Blood tests for lipids and cholesterol should begin at age 76 and be repeated every 5 years. If your lipid or cholesterol levels are high, you are over 50, or you are at high risk for heart disease, you may need your cholesterol levels checked more frequently.Ongoing high lipid and cholesterol levels should be treated with medicines if diet and exercise are not working.  If you smoke, find out from your health care provider how to quit. If you do not use tobacco, do not start.  Lung cancer screening is recommended for adults aged 22-80 years who are at high risk for developing lung cancer because of a history of smoking. A yearly low-dose CT scan of the lungs is recommended for people who have at least a 30-pack-year history of smoking and are a current smoker or have quit within the past 15 years. A pack year of smoking is smoking an average of 1 pack of cigarettes a day for 1 year (for example: 1 pack a day for 30 years or 2 packs a day for 15 years). Yearly screening should continue until the smoker has stopped smoking for at least 15 years. Yearly screening should be stopped for people who develop a health problem that would prevent them from having lung cancer treatment.  If you are pregnant, do not drink alcohol. If you are breastfeeding,  be very cautious about drinking alcohol. If you are not pregnant and choose to drink alcohol, do not have more than 1 drink per day. One drink is considered to be 12 ounces (355 mL) of beer, 5 ounces (148 mL) of wine, or 1.5 ounces (44 mL) of liquor.  Avoid use of street drugs. Do not share needles with anyone. Ask for help if you need support or instructions about stopping the use of drugs.  High blood pressure causes heart disease and increases the risk of  stroke. Your blood pressure should be checked at least every 1 to 2 years. Ongoing high blood pressure should be treated with medicines if weight loss and exercise do not work.  If you are 3-86 years old, ask your health care provider if you should take aspirin to prevent strokes.  Diabetes screening involves taking a blood sample to check your fasting blood sugar level. This should be done once every 3 years, after age 67, if you are within normal weight and without risk factors for diabetes. Testing should be considered at a younger age or be carried out more frequently if you are overweight and have at least 1 risk factor for diabetes.  Breast cancer screening is essential preventive care for women. You should practice "breast self-awareness." This means understanding the normal appearance and feel of your breasts and may include breast self-examination. Any changes detected, no matter how small, should be reported to a health care provider. Women in their 8s and 30s should have a clinical breast exam (CBE) by a health care provider as part of a regular health exam every 1 to 3 years. After age 70, women should have a CBE every year. Starting at age 25, women should consider having a mammogram (breast X-ray test) every year. Women who have a family history of breast cancer should talk to their health care provider about genetic screening. Women at a high risk of breast cancer should talk to their health care providers about having an MRI and a mammogram every year.  Breast cancer gene (BRCA)-related cancer risk assessment is recommended for women who have family members with BRCA-related cancers. BRCA-related cancers include breast, ovarian, tubal, and peritoneal cancers. Having family members with these cancers may be associated with an increased risk for harmful changes (mutations) in the breast cancer genes BRCA1 and BRCA2. Results of the assessment will determine the need for genetic counseling and  BRCA1 and BRCA2 testing.  Routine pelvic exams to screen for cancer are no longer recommended for nonpregnant women who are considered low risk for cancer of the pelvic organs (ovaries, uterus, and vagina) and who do not have symptoms. Ask your health care provider if a screening pelvic exam is right for you.  If you have had past treatment for cervical cancer or a condition that could lead to cancer, you need Pap tests and screening for cancer for at least 20 years after your treatment. If Pap tests have been discontinued, your risk factors (such as having a new sexual partner) need to be reassessed to determine if screening should be resumed. Some women have medical problems that increase the chance of getting cervical cancer. In these cases, your health care provider may recommend more frequent screening and Pap tests.  The HPV test is an additional test that may be used for cervical cancer screening. The HPV test looks for the virus that can cause the cell changes on the cervix. The cells collected during the Pap test can be  tested for HPV. The HPV test could be used to screen women aged 30 years and older, and should be used in women of any age who have unclear Pap test results. After the age of 30, women should have HPV testing at the same frequency as a Pap test.  Colorectal cancer can be detected and often prevented. Most routine colorectal cancer screening begins at the age of 50 years and continues through age 75 years. However, your health care provider may recommend screening at an earlier age if you have risk factors for colon cancer. On a yearly basis, your health care provider may provide home test kits to check for hidden blood in the stool. Use of a small camera at the end of a tube, to directly examine the colon (sigmoidoscopy or colonoscopy), can detect the earliest forms of colorectal cancer. Talk to your health care provider about this at age 50, when routine screening begins. Direct  exam of the colon should be repeated every 5-10 years through age 75 years, unless early forms of pre-cancerous polyps or small growths are found.  People who are at an increased risk for hepatitis B should be screened for this virus. You are considered at high risk for hepatitis B if:  You were born in a country where hepatitis B occurs often. Talk with your health care provider about which countries are considered high risk.  Your parents were born in a high-risk country and you have not received a shot to protect against hepatitis B (hepatitis B vaccine).  You have HIV or AIDS.  You use needles to inject street drugs.  You live with, or have sex with, someone who has hepatitis B.  You get hemodialysis treatment.  You take certain medicines for conditions like cancer, organ transplantation, and autoimmune conditions.  Hepatitis C blood testing is recommended for all people born from 1945 through 1965 and any individual with known risks for hepatitis C.  Practice safe sex. Use condoms and avoid high-risk sexual practices to reduce the spread of sexually transmitted infections (STIs). STIs include gonorrhea, chlamydia, syphilis, trichomonas, herpes, HPV, and human immunodeficiency virus (HIV). Herpes, HIV, and HPV are viral illnesses that have no cure. They can result in disability, cancer, and death.  You should be screened for sexually transmitted illnesses (STIs) including gonorrhea and chlamydia if:  You are sexually active and are younger than 24 years.  You are older than 24 years and your health care provider tells you that you are at risk for this type of infection.  Your sexual activity has changed since you were last screened and you are at an increased risk for chlamydia or gonorrhea. Ask your health care provider if you are at risk.  If you are at risk of being infected with HIV, it is recommended that you take a prescription medicine daily to prevent HIV infection. This is  called preexposure prophylaxis (PrEP). You are considered at risk if:  You are a heterosexual woman, are sexually active, and are at increased risk for HIV infection.  You take drugs by injection.  You are sexually active with a partner who has HIV.  Talk with your health care provider about whether you are at high risk of being infected with HIV. If you choose to begin PrEP, you should first be tested for HIV. You should then be tested every 3 months for as long as you are taking PrEP.  Osteoporosis is a disease in which the bones lose minerals and strength   with aging. This can result in serious bone fractures or breaks. The risk of osteoporosis can be identified using a bone density scan. Women ages 65 years and over and women at risk for fractures or osteoporosis should discuss screening with their health care providers. Ask your health care provider whether you should take a calcium supplement or vitamin D to reduce the rate of osteoporosis.  Menopause can be associated with physical symptoms and risks. Hormone replacement therapy is available to decrease symptoms and risks. You should talk to your health care provider about whether hormone replacement therapy is right for you.  Use sunscreen. Apply sunscreen liberally and repeatedly throughout the day. You should seek shade when your shadow is shorter than you. Protect yourself by wearing long sleeves, pants, a wide-brimmed hat, and sunglasses year round, whenever you are outdoors.  Once a month, do a whole body skin exam, using a mirror to look at the skin on your back. Tell your health care provider of new moles, moles that have irregular borders, moles that are larger than a pencil eraser, or moles that have changed in shape or color.  Stay current with required vaccines (immunizations).  Influenza vaccine. All adults should be immunized every year.  Tetanus, diphtheria, and acellular pertussis (Td, Tdap) vaccine. Pregnant women should  receive 1 dose of Tdap vaccine during each pregnancy. The dose should be obtained regardless of the length of time since the last dose. Immunization is preferred during the 27th-36th week of gestation. An adult who has not previously received Tdap or who does not know her vaccine status should receive 1 dose of Tdap. This initial dose should be followed by tetanus and diphtheria toxoids (Td) booster doses every 10 years. Adults with an unknown or incomplete history of completing a 3-dose immunization series with Td-containing vaccines should begin or complete a primary immunization series including a Tdap dose. Adults should receive a Td booster every 10 years.  Varicella vaccine. An adult without evidence of immunity to varicella should receive 2 doses or a second dose if she has previously received 1 dose. Pregnant females who do not have evidence of immunity should receive the first dose after pregnancy. This first dose should be obtained before leaving the health care facility. The second dose should be obtained 4-8 weeks after the first dose.  Human papillomavirus (HPV) vaccine. Females aged 13-26 years who have not received the vaccine previously should obtain the 3-dose series. The vaccine is not recommended for use in pregnant females. However, pregnancy testing is not needed before receiving a dose. If a female is found to be pregnant after receiving a dose, no treatment is needed. In that case, the remaining doses should be delayed until after the pregnancy. Immunization is recommended for any person with an immunocompromised condition through the age of 26 years if she did not get any or all doses earlier. During the 3-dose series, the second dose should be obtained 4-8 weeks after the first dose. The third dose should be obtained 24 weeks after the first dose and 16 weeks after the second dose.  Zoster vaccine. One dose is recommended for adults aged 60 years or older unless certain conditions are  present.  Measles, mumps, and rubella (MMR) vaccine. Adults born before 1957 generally are considered immune to measles and mumps. Adults born in 1957 or later should have 1 or more doses of MMR vaccine unless there is a contraindication to the vaccine or there is laboratory evidence of immunity to   each of the three diseases. A routine second dose of MMR vaccine should be obtained at least 28 days after the first dose for students attending postsecondary schools, health care workers, or international travelers. People who received inactivated measles vaccine or an unknown type of measles vaccine during 1963-1967 should receive 2 doses of MMR vaccine. People who received inactivated mumps vaccine or an unknown type of mumps vaccine before 1979 and are at high risk for mumps infection should consider immunization with 2 doses of MMR vaccine. For females of childbearing age, rubella immunity should be determined. If there is no evidence of immunity, females who are not pregnant should be vaccinated. If there is no evidence of immunity, females who are pregnant should delay immunization until after pregnancy. Unvaccinated health care workers born before 1957 who lack laboratory evidence of measles, mumps, or rubella immunity or laboratory confirmation of disease should consider measles and mumps immunization with 2 doses of MMR vaccine or rubella immunization with 1 dose of MMR vaccine.  Pneumococcal 13-valent conjugate (PCV13) vaccine. When indicated, a person who is uncertain of her immunization history and has no record of immunization should receive the PCV13 vaccine. An adult aged 19 years or older who has certain medical conditions and has not been previously immunized should receive 1 dose of PCV13 vaccine. This PCV13 should be followed with a dose of pneumococcal polysaccharide (PPSV23) vaccine. The PPSV23 vaccine dose should be obtained at least 8 weeks after the dose of PCV13 vaccine. An adult aged 19  years or older who has certain medical conditions and previously received 1 or more doses of PPSV23 vaccine should receive 1 dose of PCV13. The PCV13 vaccine dose should be obtained 1 or more years after the last PPSV23 vaccine dose.  Pneumococcal polysaccharide (PPSV23) vaccine. When PCV13 is also indicated, PCV13 should be obtained first. All adults aged 65 years and older should be immunized. An adult younger than age 65 years who has certain medical conditions should be immunized. Any person who resides in a nursing home or long-term care facility should be immunized. An adult smoker should be immunized. People with an immunocompromised condition and certain other conditions should receive both PCV13 and PPSV23 vaccines. People with human immunodeficiency virus (HIV) infection should be immunized as soon as possible after diagnosis. Immunization during chemotherapy or radiation therapy should be avoided. Routine use of PPSV23 vaccine is not recommended for American Indians, [place] Natives, or people younger than 65 years unless there are medical conditions that require PPSV23 vaccine. When indicated, people who have unknown immunization and have no record of immunization should receive PPSV23 vaccine. One-time revaccination 5 years after the first dose of PPSV23 is recommended for people aged 19-64 years who have chronic kidney failure, nephrotic syndrome, asplenia, or immunocompromised conditions. People who received 1-2 doses of PPSV23 before age 65 years should receive another dose of PPSV23 vaccine at age 65 years or later if at least 5 years have passed since the previous dose. Doses of PPSV23 are not needed for people immunized with PPSV23 at or after age 65 years.  Meningococcal vaccine. Adults with asplenia or persistent complement component deficiencies should receive 2 doses of quadrivalent meningococcal conjugate (MenACWY-D) vaccine. The doses should be obtained at least 2 months apart.  Microbiologists working with certain meningococcal bacteria, military recruits, people at risk during an outbreak, and people who travel to or live in countries with a high rate of meningitis should be immunized. A first-year college student up through age   21 years who is living in a residence hall should receive a dose if she did not receive a dose on or after her 16th birthday. Adults who have certain high-risk conditions should receive one or more doses of vaccine.  Hepatitis A vaccine. Adults who wish to be protected from this disease, have certain high-risk conditions, work with hepatitis A-infected animals, work in hepatitis A research labs, or travel to or work in countries with a high rate of hepatitis A should be immunized. Adults who were previously unvaccinated and who anticipate close contact with an international adoptee during the first 60 days after arrival in the [place] from a country with a high rate of hepatitis A should be immunized.  Hepatitis B vaccine. Adults who wish to be protected from this disease, have certain high-risk conditions, may be exposed to blood or other infectious body fluids, are household contacts or sex partners of hepatitis B positive people, are clients or workers in certain care facilities, or travel to or work in countries with a high rate of hepatitis B should be immunized.  Haemophilus influenzae type b (Hib) vaccine. A previously unvaccinated person with asplenia or sickle cell disease or having a scheduled splenectomy should receive 1 dose of Hib vaccine. Regardless of previous immunization, a recipient of a hematopoietic stem cell transplant should receive a 3-dose series 6-12 months after her successful transplant. Hib vaccine is not recommended for adults with HIV infection. Preventive Services / Frequency Ages 64 to 68 years  Blood pressure check.** / Every 1 to 2 years.  Lipid and cholesterol check.** / Every 5 years beginning at age  22.  Clinical breast exam.** / Every 3 years for women in their 88s and 53s.  BRCA-related cancer risk assessment.** / For women who have family members with a BRCA-related cancer (breast, ovarian, tubal, or peritoneal cancers).  Pap test.** / Every 2 years from ages 90 through 51. Every 3 years starting at age 21 through age 56 or 3 with a history of 3 consecutive normal Pap tests.  HPV screening.** / Every 3 years from ages 24 through ages 1 to 46 with a history of 3 consecutive normal Pap tests.  Hepatitis C blood test.** / For any individual with known risks for hepatitis C.  Skin self-exam. / Monthly.  Influenza vaccine. / Every year.  Tetanus, diphtheria, and acellular pertussis (Tdap, Td) vaccine.** / Consult your health care provider. Pregnant women should receive 1 dose of Tdap vaccine during each pregnancy. 1 dose of Td every 10 years.  Varicella vaccine.** / Consult your health care provider. Pregnant females who do not have evidence of immunity should receive the first dose after pregnancy.  HPV vaccine. / 3 doses over 6 months, if 72 and younger. The vaccine is not recommended for use in pregnant females. However, pregnancy testing is not needed before receiving a dose.  Measles, mumps, rubella (MMR) vaccine.** / You need at least 1 dose of MMR if you were born in 1957 or later. You may also need a 2nd dose. For females of childbearing age, rubella immunity should be determined. If there is no evidence of immunity, females who are not pregnant should be vaccinated. If there is no evidence of immunity, females who are pregnant should delay immunization until after pregnancy.  Pneumococcal 13-valent conjugate (PCV13) vaccine.** / Consult your health care provider.  Pneumococcal polysaccharide (PPSV23) vaccine.** / 1 to 2 doses if you smoke cigarettes or if you have certain conditions.  Meningococcal vaccine.** /  1 dose if you are age 19 to 21 years and a first-year college  student living in a residence hall, or have one of several medical conditions, you need to get vaccinated against meningococcal disease. You may also need additional booster doses.  Hepatitis A vaccine.** / Consult your health care provider.  Hepatitis B vaccine.** / Consult your health care provider.  Haemophilus influenzae type b (Hib) vaccine.** / Consult your health care provider. Ages 40 to 64 years  Blood pressure check.** / Every 1 to 2 years.  Lipid and cholesterol check.** / Every 5 years beginning at age 20 years.  Lung cancer screening. / Every year if you are aged 55-80 years and have a 30-pack-year history of smoking and currently smoke or have quit within the past 15 years. Yearly screening is stopped once you have quit smoking for at least 15 years or develop a health problem that would prevent you from having lung cancer treatment.  Clinical breast exam.** / Every year after age 40 years.  BRCA-related cancer risk assessment.** / For women who have family members with a BRCA-related cancer (breast, ovarian, tubal, or peritoneal cancers).  Mammogram.** / Every year beginning at age 40 years and continuing for as long as you are in good health. Consult with your health care provider.  Pap test.** / Every 3 years starting at age 30 years through age 65 or 70 years with a history of 3 consecutive normal Pap tests.  HPV screening.** / Every 3 years from ages 30 years through ages 65 to 70 years with a history of 3 consecutive normal Pap tests.  Fecal occult blood test (FOBT) of stool. / Every year beginning at age 50 years and continuing until age 75 years. You may not need to do this test if you get a colonoscopy every 10 years.  Flexible sigmoidoscopy or colonoscopy.** / Every 5 years for a flexible sigmoidoscopy or every 10 years for a colonoscopy beginning at age 50 years and continuing until age 75 years.  Hepatitis C blood test.** / For all people born from 1945 through  1965 and any individual with known risks for hepatitis C.  Skin self-exam. / Monthly.  Influenza vaccine. / Every year.  Tetanus, diphtheria, and acellular pertussis (Tdap/Td) vaccine.** / Consult your health care provider. Pregnant women should receive 1 dose of Tdap vaccine during each pregnancy. 1 dose of Td every 10 years.  Varicella vaccine.** / Consult your health care provider. Pregnant females who do not have evidence of immunity should receive the first dose after pregnancy.  Zoster vaccine.** / 1 dose for adults aged 60 years or older.  Measles, mumps, rubella (MMR) vaccine.** / You need at least 1 dose of MMR if you were born in 1957 or later. You may also need a 2nd dose. For females of childbearing age, rubella immunity should be determined. If there is no evidence of immunity, females who are not pregnant should be vaccinated. If there is no evidence of immunity, females who are pregnant should delay immunization until after pregnancy.  Pneumococcal 13-valent conjugate (PCV13) vaccine.** / Consult your health care provider.  Pneumococcal polysaccharide (PPSV23) vaccine.** / 1 to 2 doses if you smoke cigarettes or if you have certain conditions.  Meningococcal vaccine.** / Consult your health care provider.  Hepatitis A vaccine.** / Consult your health care provider.  Hepatitis B vaccine.** / Consult your health care provider.  Haemophilus influenzae type b (Hib) vaccine.** / Consult your health care provider. Ages 65   years and over  Blood pressure check.** / Every 1 to 2 years.  Lipid and cholesterol check.** / Every 5 years beginning at age 41 years.  Lung cancer screening. / Every year if you are aged 60-80 years and have a 30-pack-year history of smoking and currently smoke or have quit within the past 15 years. Yearly screening is stopped once you have quit smoking for at least 15 years or develop a health problem that would prevent you from having lung cancer  treatment.  Clinical breast exam.** / Every year after age 92 years.  BRCA-related cancer risk assessment.** / For women who have family members with a BRCA-related cancer (breast, ovarian, tubal, or peritoneal cancers).  Mammogram.** / Every year beginning at age 49 years and continuing for as long as you are in good health. Consult with your health care provider.  Pap test.** / Every 3 years starting at age 68 years through age 43 or 82 years with 3 consecutive normal Pap tests. Testing can be stopped between 65 and 70 years with 3 consecutive normal Pap tests and no abnormal Pap or HPV tests in the past 10 years.  HPV screening.** / Every 3 years from ages 8 years through ages 44 or 82 years with a history of 3 consecutive normal Pap tests. Testing can be stopped between 65 and 70 years with 3 consecutive normal Pap tests and no abnormal Pap or HPV tests in the past 10 years.  Fecal occult blood test (FOBT) of stool. / Every year beginning at age 55 years and continuing until age 7 years. You may not need to do this test if you get a colonoscopy every 10 years.  Flexible sigmoidoscopy or colonoscopy.** / Every 5 years for a flexible sigmoidoscopy or every 10 years for a colonoscopy beginning at age 52 years and continuing until age 51 years.  Hepatitis C blood test.** / For all people born from [birth year] through 1964 and any individual with known risks for hepatitis C.  Osteoporosis screening.** / A one-time screening for women ages 14 years and over and women at risk for fractures or osteoporosis.  Skin self-exam. / Monthly.  Influenza vaccine. / Every year.  Tetanus, diphtheria, and acellular pertussis (Tdap/Td) vaccine.** / 1 dose of Td every 10 years.  Varicella vaccine.** / Consult your health care provider.  Zoster vaccine.** / 1 dose for adults aged 52 years or older.  Pneumococcal 13-valent conjugate (PCV13) vaccine.** / Consult your health care provider.  Pneumococcal  polysaccharide (PPSV23) vaccine.** / 1 dose for all adults aged 58 years and older.  Meningococcal vaccine.** / Consult your health care provider.  Hepatitis A vaccine.** / Consult your health care provider.  Hepatitis B vaccine.** / Consult your health care provider.  Haemophilus influenzae type b (Hib) vaccine.** / Consult your health care provider. ** Family history and personal history of risk and conditions may change your health care provider's recommendations. Document Released: 10/22/2001 Document Revised: 01/10/2014 Document Reviewed: 01/21/2011 [facility] Patient Information 2015 [place], [place]. This information is not intended to replace advice given to you by your health care provider. Make sure you discuss any questions you have with your health care provider. Panic Attacks Panic attacks are sudden, short-livedsurges of severe anxiety, fear, or discomfort. They may occur for no reason when you are relaxed, when you are anxious, or when you are sleeping. Panic attacks may occur for a number of reasons:   Healthy people occasionally have panic attacks in extreme, life-threatening situations, such  as war or natural disasters. Normal anxiety is a protective mechanism of the body that helps [place] react to danger (fight or flight response).  Panic attacks are often seen with anxiety disorders, such as panic disorder, social anxiety disorder, generalized anxiety disorder, and phobias. Anxiety disorders cause excessive or uncontrollable anxiety. They may interfere with your relationships or other life activities.  Panic attacks are sometimes seen with other mental illnesses, such as depression and posttraumatic stress disorder.  Certain medical conditions, prescription medicines, and drugs of abuse can cause panic attacks. SYMPTOMS  Panic attacks start suddenly, peak within 20 minutes, and are accompanied by four or more of the following symptoms:  Pounding heart or fast heart rate  (palpitations).  Sweating.  Trembling or shaking.  Shortness of breath or feeling smothered.  Feeling choked.  Chest pain or discomfort.  Nausea or strange feeling in your stomach.  Dizziness, light-headedness, or feeling like you will faint.  Chills or hot flushes.  Numbness or tingling in your lips or hands and feet.  Feeling that things are not real or feeling that you are not yourself.  Fear of losing control or going crazy.  Fear of dying. Some of these symptoms can mimic serious medical conditions. For example, you may think you are having a heart attack. Although panic attacks can be very scary, they are not life threatening. DIAGNOSIS  Panic attacks are diagnosed through an assessment by your health care provider. Your health care provider will ask questions about your symptoms, such as where and when they occurred. Your health care provider will also ask about your medical history and use of alcohol and drugs, including prescription medicines. Your health care provider may order blood tests or other studies to rule out a serious medical condition. Your health care provider may refer you to a mental health professional for further evaluation. TREATMENT   Most healthy people who have one or two panic attacks in an extreme, life-threatening situation will not require treatment.  The treatment for panic attacks associated with anxiety disorders or other mental illness typically involves counseling with a mental health professional, medicine, or a combination of both. Your health care provider will help determine what treatment is best for you.  Panic attacks due to physical illness usually go away with treatment of the illness. If prescription medicine is causing panic attacks, talk with your health care provider about stopping the medicine, decreasing the dose, or substituting another medicine.  Panic attacks due to alcohol or drug abuse go away with abstinence. Some adults  need professional help in order to stop drinking or using drugs. HOME CARE INSTRUCTIONS   Take all medicines as directed by your health care provider.   Schedule and attend follow-up visits as directed by your health care provider. It is important to keep all your appointments. SEEK MEDICAL CARE IF:  You are not able to take your medicines as prescribed.  Your symptoms do not improve or get worse. SEEK IMMEDIATE MEDICAL CARE IF:   You experience panic attack symptoms that are different than your usual symptoms.  You have serious thoughts about hurting yourself or others.  You are taking medicine for panic attacks and have a serious side effect. MAKE SURE YOU:  Understand these instructions.  Will watch your condition.  Will get help right away if you are not doing well or get worse. Document Released: 08/26/2005 Document Revised: 08/31/2013 Document Reviewed: 04/09/2013 [facility] Patient Information 2015 [place], [place]. This information is not intended to  replace advice given to you by your health care provider. Make sure you discuss any questions you have with your health care provider. Psoriasis Psoriasis is a common, long-lasting (chronic) inflammation of the skin. It affects both men and women equally, of all ages and all races. Psoriasis cannot be passed from person to person (not contagious). Psoriasis varies from mild to very severe. When severe, it can greatly affect your quality of life. Psoriasis is an inflammatory disorder affecting the skin as well as other organs including the joints (causing an arthritis). With psoriasis, the skin sheds its top layer of cells more rapidly than it does in someone without psoriasis. CAUSES  The cause of psoriasis is largely unknown. Genetics, your immune system, and the environment seem to play a role in causing psoriasis. Factors that can make psoriasis worse include:  Damage or trauma to the skin, such as cuts, scrapes, and sunburn.  This damage often causes new areas of psoriasis (lesions).  Winter dryness and lack of sunlight.  Medicines such as lithium, beta-blockers, antimalarial drugs, ACE inhibitors, nonsteroidal anti-inflammatory drugs (ibuprofen, aspirin), and terbinafine. Let your caregiver know if you are taking any of these drugs.  Alcohol. Excessive alcohol use should be avoided if you have psoriasis. Drinking large amounts of alcohol can affect:  How well your psoriasis treatment works.  How safe your psoriasis treatment is.  Smoking. If you smoke, ask your caregiver for help to quit.  Stress.  Bacterial or viral infections.  Arthritis. Arthritis associated with psoriasis (psoriatic arthritis) affects less than 10% of patients with psoriasis. The arthritic intensity does not always match the skin psoriasis intensity. It is important to let your caregiver know if your joints hurt or if they are stiff. SYMPTOMS  The most common form of psoriasis begins with little red bumps that gradually become larger. The bumps begin to form scales that flake off easily. The lower layers of scales stick together. When these scales are scratched or removed, the underlying skin is tender and bleeds easily. These areas then grow in size and may become large. Psoriasis often creates a rash that looks the same on both sides of the body (symmetrical). It often affects the elbows, knees, groin, genitals, arms, legs, scalp, and nails. Affected nails often have pitting, loosen, thicken, crumble, and are difficult to treat.  "Inverse psoriasis"occurs in the armpits, under breasts, in skin folds, and around the groin, buttocks, and genitals.  "Guttate psoriasis" generally occurs in children and young adults following a recent sore throat (strep throat). It begins with many small, red, scaly spots on the skin. It clears spontaneously in weeks or a few months without treatment. DIAGNOSIS  Psoriasis is diagnosed by physical exam. A  tissue sample (biopsy) may also be taken. TREATMENT The treatment of psoriasis depends on your age, health, and living conditions.  Steroid (cortisone) creams, lotions, and ointments may be used. These treatments are associated with thinning of the skin, blood vessels that get larger (dilated), loss of skin pigmentation, and easy bruising. It is important to use these steroids as directed by your caregiver. Only treat the affected areas and not the normal, unaffected skin. People on long-term steroid treatment should wear a medical alert bracelet. Injections may be used in areas that are difficult to treat.  Scalp treatments are available as shampoos, solutions, sprays, foams, and oils. Avoid scratching the scalp and picking at the scales.  Anthralin medicine works well on areas that are difficult to treat. However, it stains clothes  and skin and may cause temporary irritation.  Synthetic vitamin D (calcipotriene)can be used on small areas. It is available by prescription. The forms of synthetic vitamin D available in health food stores do not help with psoriasis.  Coal tarsare available in various strengths for psoriasis that is difficult to treat. They are one of the longest used treatments for difficult to treat psoriasis. However, they are messy to use.  Light therapy (UV therapy) can be carefully and professionally monitored in a dermatologist's office. Careful sunbathing is helpful for many people as directed by your caregiver. The exposure should be just long enough to cause a mild redness (erythema) of your skin. Avoid sunburn as this may make the condition worse. Sunscreen (SPF of 30 or higher) should be used to protect against sunburn. Cataracts, wrinkles, and skin aging are some of the harmful side effects of light therapy.  If creams (topical medicines) fail, there are several other options for systemic or oral medicines your caregiver can suggest. Psoriasis can sometimes be very  difficult to treat. It can come and go. It is necessary to follow up with your caregiver regularly if your psoriasis is difficult to treat. Usually, with persistence you can get a good amount of relief. Maintaining consistent care is important. Do not change caregivers just because you do not see immediate results. It may take several trials to find the right combination of treatment for you. PREVENTING FLARE-UPS  Wear gloves while you wash dishes, while cleaning, and when you are outside in the cold.  If you have radiators, place a bowl of water or damp towel on the radiator. This will help put water back in the air. You can also use a humidifier to keep the air moist. Try to keep the humidity at about 60% in your home.  Apply moisturizer while your skin is still damp from bathing or showering. This traps water in the skin.  Avoid long, hot baths or showers. Keep soap use to a minimum. Soaps dry out the skin and wash away the protective oils. Use a fragrance free, dye free soap.  Drink enough water and fluids to keep your urine clear or pale yellow. Not drinking enough water depletes your skin's water supply.  Turn off the heat at night and keep it low during the day. Cool air is less drying. SEEK MEDICAL CARE IF:  You have increasing pain in the affected areas.  You have uncontrolled bleeding in the affected areas.  You have increasing redness or warmth in the affected areas.  You start to have pain or stiffness in your joints.  You start feeling depressed about your condition.  You have a fever. Document Released: 08/23/2000 Document Revised: 11/18/2011 Document Reviewed: 02/18/2011 [facility] Patient Information 2015 [place], [place]. This information is not intended to replace advice given to you by your health care provider. Make sure you discuss any questions you have with your health care provider.

## 2014-09-20 NOTE — Progress Notes (Signed)
Subjective:     Makayla Duke is a 39 y.o. female and is here for a comprehensive physical exam. 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  She will see Dermatology in 2 days.  She has regular cycles.   History   Social History  . Marital Status: Single    Spouse Name: N/A    Number of Children: 2  . Years of Education: N/A   Occupational History  . Bartender/Waitress    Social History Main Topics  . Smoking status: Never Smoker   . Smokeless tobacco: Never Used  . Alcohol Use: 0.0 oz/week    0 Not specified per week     Comment: Occasional  . Drug Use: No  . Sexual Activity: Not Currently    Birth Control/ Protection: None   Other Topics Concern  . Not on file   Social History Narrative   Caffeine: occasional   Lives with 2 daughters   Occupation: bartender/server   Activity: no regular exercise   Diet: not enough water.  Good vegetables   Health Maintenance  Topic Date Due  . PAP SMEAR  05/10/1995  . TETANUS/TDAP  05/09/1996  . INFLUENZA VACCINE  12/08/2014 (Originally 04/09/2014)  . HIV Screening  Completed    The following portions of the patient's history were reviewed and updated as appropriate: allergies, current medications, past family history, past medical history, past social history, past surgical history and problem list.  Review of Systems Pertinent items are noted in HPI.   Objective:    BP 123/94 mmHg  Pulse 81  Ht 5\' 4"  (1.626 m)  Wt 214 lb 9.6 oz (97.342 kg)  BMI 36.82 kg/m2  LMP 08/30/2014 (Approximate) General appearance: alert, cooperative and appears stated age Head: Normocephalic, without obvious abnormality, atraumatic Neck: no adenopathy, supple, symmetrical, trachea midline and thyroid not enlarged, symmetric, no tenderness/mass/nodules Lungs: clear to auscultation bilaterally Breasts: normal appearance, no masses or  tenderness Heart: regular rate and rhythm, S1, S2 normal, no murmur, click, rub or gallop Abdomen: soft, non-tender; bowel sounds normal; no masses,  no organomegaly Pelvic: cervix normal in appearance, no adnexal masses or tenderness, no cervical motion tenderness, rectovaginal septum normal, uterus normal size, shape, and consistency, vagina normal without discharge and red plaques noted on labia minora and intertringonal regions and peri-anally Extremities: extremities normal, atraumatic, no cyanosis or edema Pulses: 2+ and symmetric Skin: see GU description, also with large mole on right breast. Lymph nodes: Cervical, supraclavicular, and axillary nodes normal. Neurologic: Grossly normal    Assessment:    Healthy female exam.      Plan:      Problem List Items Addressed This Visit      Unprioritized   Psoriasis    To f/u with her dermatolgist for treatment.    Relevant Medications      triamcinolone (KENALOG) ointment 0.025%    Other Visit Diagnoses    Encounter for routine gynecological examination    -  Primary    Relevant Orders       TSH       CBC       Lipid panel       Comprehensive metabolic panel       Cytology - PAP    Screening for malignant neoplasm of cervix        Relevant Orders       Cytology - PAP  See After Visit Summary for Counseling Recommendations

## 2014-09-20 NOTE — Progress Notes (Signed)
Patient thinks her psoriasis has spread to her groin area and it has been getting cracked and painful over the past year.  She has tried several treatments for her psoriasis and has not had any luck so far, right now she is looking into getting into a study.  Her last pap was two years ago and was normal.  Has had one abnormal in the past that resolved with a repeat pap.

## 2014-09-22 ENCOUNTER — Encounter: Payer: Self-pay | Admitting: Family Medicine

## 2014-09-22 ENCOUNTER — Ambulatory Visit: Payer: Self-pay

## 2014-09-22 DIAGNOSIS — R87810 Cervical high risk human papillomavirus (HPV) DNA test positive: Secondary | ICD-10-CM | POA: Insufficient documentation

## 2014-09-22 LAB — CBC WITH DIFFERENTIAL/PLATELET
BASOS ABS: 0 10*3/uL (ref 0.0–0.1)
Basophil %: 0.6 %
Eosinophil #: 0.1 10*3/uL (ref 0.0–0.7)
Eosinophil %: 1.4 %
HCT: 41.5 % (ref 35.0–47.0)
HGB: 13.9 g/dL (ref 12.0–16.0)
LYMPHS PCT: 29 %
Lymphocyte #: 2 10*3/uL (ref 1.0–3.6)
MCH: 28.1 pg (ref 26.0–34.0)
MCHC: 33.4 g/dL (ref 32.0–36.0)
MCV: 84 fL (ref 80–100)
MONOS PCT: 6.9 %
Monocyte #: 0.5 x10 3/mm (ref 0.2–0.9)
NEUTROS ABS: 4.3 10*3/uL (ref 1.4–6.5)
Neutrophil %: 62.1 %
PLATELETS: 204 10*3/uL (ref 150–440)
RBC: 4.92 10*6/uL (ref 3.80–5.20)
RDW: 12.6 % (ref 11.5–14.5)
WBC: 7 10*3/uL (ref 3.6–11.0)

## 2014-09-22 LAB — BASIC METABOLIC PANEL
Anion Gap: 7 (ref 7–16)
BUN: 18 mg/dL (ref 7–18)
CALCIUM: 9.5 mg/dL (ref 8.5–10.1)
CHLORIDE: 100 mmol/L (ref 98–107)
Co2: 31 mmol/L (ref 21–32)
Creatinine: 0.9 mg/dL (ref 0.60–1.30)
EGFR (African American): >60
EGFR (Non-African Amer.): >60
Glucose: 108 mg/dL — ABNORMAL HIGH (ref 65–99)
OSMOLALITY: 278 (ref 275–301)
Potassium: 4.4 mmol/L (ref 3.5–5.1)
Sodium: 138 mmol/L (ref 136–145)

## 2014-09-22 LAB — HEPATIC FUNCTION PANEL A (ARMC)
ALK PHOS: 60 U/L
Albumin: 4.1 g/dL (ref 3.4–5.0)
BILIRUBIN TOTAL: 0.9 mg/dL (ref 0.2–1.0)
Bilirubin, Direct: 0.2 mg/dL (ref 0.0–0.2)
SGOT(AST): 28 U/L (ref 15–37)
SGPT (ALT): 50 U/L
TOTAL PROTEIN: 7.6 g/dL (ref 6.4–8.2)

## 2014-09-22 LAB — CYTOLOGY - PAP

## 2014-09-22 NOTE — Progress Notes (Signed)
Left message for patient to call the office back regarding her test results.

## 2015-02-07 ENCOUNTER — Emergency Department: Payer: Medicaid Other

## 2015-02-07 ENCOUNTER — Emergency Department
Admission: EM | Admit: 2015-02-07 | Discharge: 2015-02-07 | Disposition: A | Discharge status: Home / Self Care | Payer: Medicaid Other | Attending: Emergency Medicine | Admitting: Emergency Medicine

## 2015-02-07 DIAGNOSIS — J069 Acute upper respiratory infection, unspecified: Secondary | ICD-10-CM

## 2015-02-07 DIAGNOSIS — Z79899 Other long term (current) drug therapy: Secondary | ICD-10-CM | POA: Insufficient documentation

## 2015-02-07 DIAGNOSIS — J45909 Unspecified asthma, uncomplicated: Secondary | ICD-10-CM | POA: Diagnosis not present

## 2015-02-07 DIAGNOSIS — J40 Bronchitis, not specified as acute or chronic: Secondary | ICD-10-CM

## 2015-02-07 DIAGNOSIS — J029 Acute pharyngitis, unspecified: Secondary | ICD-10-CM | POA: Diagnosis present

## 2015-02-07 MED ORDER — LEVOFLOXACIN 500 MG PO TABS: 500.0000 mg | ORAL_TABLET | Freq: Every day | ORAL | Status: DC

## 2015-02-07 MED ORDER — GUAIFENESIN-CODEINE 100-10 MG/5ML PO SOLN: 10.0000 mL | ORAL | Status: DC | PRN

## 2015-02-07 NOTE — Discharge Instructions (Signed)
Upper Respiratory Infection, Adult An upper respiratory infection (URI) is also known as the common cold. It is often caused by a type of germ (virus). Colds are easily spread (contagious). You can pass it to others by kissing, coughing, sneezing, or drinking out of the same glass. Usually, you get better in 1 or 2 weeks.  HOME CARE   Only take medicine as told by your doctor.  Use a warm mist humidifier or breathe in steam from a hot shower.  Drink enough water and fluids to keep your pee (urine) clear or pale yellow.  Get plenty of rest.  Return to work when your temperature is back to normal or as told by your doctor. You may use a face mask and wash your hands to stop your cold from spreading. GET HELP RIGHT AWAY IF:   After the first few days, you feel you are getting worse.  You have questions about your medicine.  You have chills, shortness of breath, or brown or red spit (mucus).  You have yellow or brown snot (nasal discharge) or pain in the face, especially when you bend forward.  You have a fever, puffy (swollen) neck, pain when you swallow, or white spots in the back of your throat.  You have a bad headache, ear pain, sinus pain, or chest pain.  You have a high-pitched whistling sound when you breathe in and out (wheezing).  You have a lasting cough or cough up blood.  You have sore muscles or a stiff neck. MAKE SURE YOU:   Understand these instructions.  Will watch your condition.  Will get help right away if you are not doing well or get worse. Document Released: 02/12/2008 Document Revised: 11/18/2011 Document Reviewed: 12/01/2013 [employer] Patient Information 2015 [employer]. This information is not intended to replace advice given to you by your health care provider. Make sure you discuss any questions you have with your health care provider.  

## 2015-02-07 NOTE — ED Notes (Signed)
Pt c/o sore throat for the past 4 days with a fever for the past 2 days

## 2015-02-07 NOTE — ED Provider Notes (Signed)
[facility] Emergency Department Provider Note  ____________________________________________  Time seen: Approximately 2:30 PM  I have reviewed the triage vital signs and the nursing notes.   HISTORY  Chief Complaint Sore Throat    HPI Makayla Duke is a 39 y.o. female is for evaluation of deep cough and runny nose congestion and drainage for sore throat. Patient reports being recently treated for strep with Zithromax. Planes of of having fever, chills, body aches as well. For the last 4 days.   Past Medical History  Diagnosis Date  . Childhood asthma   . Tension headache   . Elevated blood pressure reading without diagnosis of hypertension   . Migraines   . Panic attacks   . Sleep apnea   . Cervicalgia     Dion Saucier)    Patient Active Problem List   Diagnosis Date Noted  . Cervical high risk HPV (human papillomavirus) test positive 09/22/2014  . Psoriasis 09/20/2014  . Migraine 07/05/2011  . Tension headache   . Panic attacks     Past Surgical History  Procedure Laterality Date  . Cesarean section  2007; 2009    Current Outpatient Rx  Name  Route  Sig  Dispense  Refill  . sertraline (ZOLOFT) 50 MG tablet   Oral   Take 50 mg by mouth daily.         . SUMAtriptan (IMITREX) 50 MG tablet      TAKE AS DIRECTED   10 tablet   0     plz schedule office visit prior to more refills.   Marland Kitchen acetaminophen (TYLENOL) 500 MG tablet   Oral   Take 500 mg by mouth every 6 (six) hours as needed.         Marland Kitchen guaiFENesin-codeine 100-10 MG/5ML syrup   Oral   Take 10 mLs by mouth every 4 (four) hours as needed for cough.   240 mL   0   . ibuprofen (ADVIL,MOTRIN) 200 MG tablet   Oral   Take 800 mg by mouth as needed.          Marland Kitchen levofloxacin (LEVAQUIN) 500 MG tablet   Oral   Take 1 tablet (500 mg total) by mouth daily.   10 tablet   0   . triamcinolone (KENALOG) 0.025 % ointment   Topical   Apply 1 application topically 2 (two)  times daily.   30 g   0     Allergies Review of patient's allergies indicates no known allergies.  Family History  Problem Relation Age of Onset  . Hypertension Father   . Coronary artery disease Father 74    MI  . Arthritis Mother   . Migraines Mother   . Psoriasis Mother   . Breast cancer Paternal Aunt   . Cancer Paternal Aunt     breast  . Lung cancer Maternal Grandfather   . Cancer Maternal Grandfather 60    lung, smoker  . Hypertension Maternal Uncle   . Diabetes    . Lung cancer Paternal Grandmother   . Cancer Paternal Grandmother 40    lung, smoker  . Mental illness Paternal Grandmother     schizophrenia  . Mental illness Paternal Uncle     bipolar  . Stroke Neg Hx     Social History History  Substance Use Topics  . Smoking status: Never Smoker   . Smokeless tobacco: Never Used  . Alcohol Use: 0.0 oz/week    0 Standard drinks or equivalent  per week     Comment: Occasional    Review of Systems Constitutional: No fever/chills Eyes: No visual changes. ENT: No sore throat. Cardiovascular: Denies chest pain. Respiratory: Denies shortness of breath. Positive cough. Gastrointestinal: No abdominal pain.  No nausea, no vomiting.  No diarrhea.  No constipation. Genitourinary: Negative for dysuria. Musculoskeletal: Negative for back pain. Skin: Negative for rash. Neurological: Negative for headaches, focal weakness or numbness.  10-point ROS otherwise negative.  ____________________________________________   PHYSICAL EXAM:  VITAL SIGNS: ED Triage Vitals  Enc Vitals Group     BP 02/07/15 1317 148/94 mmHg     Pulse Rate 02/07/15 1317 95     Resp --      Temp 02/07/15 1317 98.1 F (36.7 C)     Temp Source 02/07/15 1317 Oral     SpO2 02/07/15 1317 100 %     Weight 02/07/15 1317 200 lb (90.719 kg)     Height 02/07/15 1317 5\' 4"  (1.626 m)     Head Cir --      Peak Flow --      Pain Score 02/07/15 1325 7     Pain Loc --      Pain Edu? --       Excl. in GC? --     Constitutional: Alert and oriented. Well appearing and in no acute distress. Eyes: Conjunctivae are normal. PERRL. EOMI. Head: Atraumatic. Nose: No congestion/rhinnorhea. Mouth/Throat: Mucous membranes are moist.  Oropharynx non-erythematous. Neck: No stridor.   Cardiovascular: Normal rate, regular rhythm. Grossly normal heart sounds.  Good peripheral circulation. Respiratory: Normal respiratory effort.  No retractions. Lungs CTAB. Gastrointestinal: Soft and nontender. No distention. No abdominal bruits. No CVA tenderness. Musculoskeletal: No lower extremity tenderness nor edema.  No joint effusions. Neurologic:  Normal speech and language. No gross focal neurologic deficits are appreciated. Speech is normal. No gait instability. Skin:  Skin is warm, dry and intact. No rash noted. Psychiatric: Mood and affect are normal. Speech and behavior are normal.  ____________________________________________   LABS (all labs ordered are listed, but only abnormal results are displayed)  Labs Reviewed - No data to display ____________________________________________  EKG  none ____________________________________________  RADIOLOGY  X-rays negative. ____________________________________________   PROCEDURES  Procedure(s) performed: None  Critical Care performed: No  ____________________________________________   INITIAL IMPRESSION / ASSESSMENT AND PLAN / ED COURSE  Pertinent labs & imaging results that were available during my care of the patient were reviewed by me and considered in my medical decision making (see chart for details).  Diagnosed with upper respiratory infection/bronchitis. We'll start on Levaquin 500 mg, Robitussin-AC for cough. ____________________________________________   FINAL CLINICAL IMPRESSION(S) / ED DIAGNOSES  Final diagnoses:  URI, acute  Bronchitis      Evangeline Dakinharles M Beers, PA-C 02/07/15 1628  Jene Everyobert Kinner, MD 02/11/15  1539

## 2015-03-26 ENCOUNTER — Encounter: Payer: Self-pay | Admitting: Emergency Medicine

## 2015-03-26 ENCOUNTER — Emergency Department
Admission: EM | Admit: 2015-03-26 | Discharge: 2015-03-26 | Disposition: A | Discharge status: Home / Self Care | Payer: Medicaid Other | Source: Home / Self Care | Attending: Emergency Medicine | Admitting: Emergency Medicine

## 2015-03-26 ENCOUNTER — Emergency Department
Admission: EM | Admit: 2015-03-26 | Discharge: 2015-03-26 | Disposition: A | Discharge status: Home / Self Care | Payer: Medicaid Other | Attending: Emergency Medicine | Admitting: Emergency Medicine

## 2015-03-26 DIAGNOSIS — G43909 Migraine, unspecified, not intractable, without status migrainosus: Secondary | ICD-10-CM | POA: Insufficient documentation

## 2015-03-26 DIAGNOSIS — G43809 Other migraine, not intractable, without status migrainosus: Secondary | ICD-10-CM | POA: Insufficient documentation

## 2015-03-26 DIAGNOSIS — Z79899 Other long term (current) drug therapy: Secondary | ICD-10-CM | POA: Insufficient documentation

## 2015-03-26 DIAGNOSIS — G43009 Migraine without aura, not intractable, without status migrainosus: Secondary | ICD-10-CM

## 2015-03-26 LAB — COMPREHENSIVE METABOLIC PANEL
ALT: 24 U/L (ref 14–54)
AST: 20 U/L (ref 15–41)
Albumin: 4.6 g/dL (ref 3.5–5.0)
Alkaline Phosphatase: 54 U/L (ref 38–126)
Anion gap: 10 (ref 5–15)
BILIRUBIN TOTAL: 1.5 mg/dL — AB (ref 0.3–1.2)
BUN: 10 mg/dL (ref 6–20)
CO2: 24 mmol/L (ref 22–32)
Calcium: 8.7 mg/dL — ABNORMAL LOW (ref 8.9–10.3)
Chloride: 96 mmol/L — ABNORMAL LOW (ref 101–111)
Creatinine, Ser: 0.53 mg/dL (ref 0.44–1.00)
GFR calc Af Amer: >60 mL/min (ref >60)
GLUCOSE: 116 mg/dL — AB (ref 65–99)
POTASSIUM: 3.1 mmol/L — AB (ref 3.5–5.1)
Sodium: 130 mmol/L — ABNORMAL LOW (ref 135–145)
Total Protein: 7.8 g/dL (ref 6.5–8.1)

## 2015-03-26 LAB — CBC
HCT: 42.6 % (ref 35.0–47.0)
Hemoglobin: 14.2 g/dL (ref 12.0–16.0)
MCH: 27.8 pg (ref 26.0–34.0)
MCHC: 33.4 g/dL (ref 32.0–36.0)
MCV: 83.1 fL (ref 80.0–100.0)
PLATELETS: 196 10*3/uL (ref 150–440)
RBC: 5.13 MIL/uL (ref 3.80–5.20)
RDW: 12.7 % (ref 11.5–14.5)
WBC: 11.4 10*3/uL — AB (ref 3.6–11.0)

## 2015-03-26 MED ORDER — PROMETHAZINE HCL 25 MG PO TABS: 25.0000 mg | ORAL_TABLET | Freq: Four times a day (QID) | ORAL | Status: DC | PRN

## 2015-03-26 MED ORDER — PROMETHAZINE HCL 25 MG/ML IJ SOLN
12.5000 mg | Freq: Once | INTRAMUSCULAR | Status: AC
  Administered 2015-03-26: 12.5 mg via INTRAVENOUS
  Filled 2015-03-26: qty 1

## 2015-03-26 MED ORDER — SODIUM CHLORIDE 0.9 % IV SOLN
1000.0000 mL | Freq: Once | INTRAVENOUS | Status: AC
  Administered 2015-03-26: 1000 mL via INTRAVENOUS

## 2015-03-26 MED ORDER — BUTALBITAL-APAP-CAFFEINE 50-325-40 MG PO TABS: 1.0000 | ORAL_TABLET | Freq: Four times a day (QID) | ORAL | Status: AC | PRN

## 2015-03-26 MED ORDER — DIPHENHYDRAMINE HCL 50 MG/ML IJ SOLN
25.0000 mg | Freq: Once | INTRAMUSCULAR | Status: AC
  Administered 2015-03-26: 25 mg via INTRAVENOUS
  Filled 2015-03-26: qty 1

## 2015-03-26 MED ORDER — METOCLOPRAMIDE HCL 5 MG/ML IJ SOLN
20.0000 mg | Freq: Once | INTRAMUSCULAR | Status: AC
  Administered 2015-03-26: 20 mg via INTRAVENOUS
  Filled 2015-03-26: qty 4

## 2015-03-26 MED ORDER — KETOROLAC TROMETHAMINE 30 MG/ML IJ SOLN
30.0000 mg | Freq: Once | INTRAMUSCULAR | Status: AC
  Administered 2015-03-26: 30 mg via INTRAVENOUS
  Filled 2015-03-26: qty 1

## 2015-03-26 MED ORDER — MEPERIDINE HCL 25 MG/ML IJ SOLN
25.0000 mg | Freq: Once | INTRAMUSCULAR | Status: AC
Start: 2015-03-26 — End: 2015-03-26
  Administered 2015-03-26: 25 mg via INTRAMUSCULAR
  Filled 2015-03-26: qty 1

## 2015-03-26 MED ORDER — SUMATRIPTAN SUCCINATE 50 MG PO TABS: ORAL_TABLET | ORAL | Status: DC

## 2015-03-26 NOTE — ED Notes (Signed)
Pharmacy called regarding reglan, will send to ED.

## 2015-03-26 NOTE — ED Notes (Addendum)
Pt reports she is here today due to migraines, vomiting and light sensitivity that started about midnight. History of migrianes. Pt takes imitrex with no relief.

## 2015-03-26 NOTE — ED Provider Notes (Signed)
[facility] Emergency Department Provider Note  ____________________________________________  Time seen: On arrival  I have reviewed the triage vital signs and the nursing notes.   HISTORY  Chief Complaint Migraine    HPI Makayla Duke is a 39 y.o. female who presents with a migraine headache. She reports this headache is typical of her migraines. She was seen in the ED earlier today and had felt slightly better but when she got home she became nauseous again. She denies neck pain. No fevers no chills. Positive nausea and positive vomiting. No abdominal pain. She reports the pain is throbbing and severe.    Past Medical History  Diagnosis Date  . Childhood asthma   . Tension headache   . Elevated blood pressure reading without diagnosis of hypertension   . Migraines   . Panic attacks   . Sleep apnea   . Cervicalgia     Dion Saucier([place])    Patient Active Problem List   Diagnosis Date Noted  . Cervical high risk HPV (human papillomavirus) test positive 09/22/2014  . Psoriasis 09/20/2014  . Migraine 07/05/2011  . Tension headache   . Panic attacks     Past Surgical History  Procedure Laterality Date  . Cesarean section  2007; 2009    Current Outpatient Rx  Name  Route  Sig  Dispense  Refill  . acetaminophen (TYLENOL) 500 MG tablet   Oral   Take 500 mg by mouth every 6 (six) hours as needed.         . promethazine (PHENERGAN) 25 MG tablet   Oral   Take 1 tablet (25 mg total) by mouth every 6 (six) hours as needed for nausea or vomiting.   15 tablet   0   . sertraline (ZOLOFT) 50 MG tablet   Oral   Take 50 mg by mouth daily.         . SUMAtriptan (IMITREX) 50 MG tablet      TAKE AS DIRECTED   10 tablet   0     plz schedule office visit prior to more refills.     Allergies Review of patient's allergies indicates no known allergies.  Family History  Problem Relation Age of Onset  . Hypertension Father   . Coronary artery  disease Father 351    MI  . Arthritis Mother   . Migraines Mother   . Psoriasis Mother   . Breast cancer Paternal Aunt   . Cancer Paternal Aunt     breast  . Lung cancer Maternal Grandfather   . Cancer Maternal Grandfather 60    lung, smoker  . Hypertension Maternal Uncle   . Diabetes    . Lung cancer Paternal Grandmother   . Cancer Paternal Grandmother 40    lung, smoker  . Mental illness Paternal Grandmother     schizophrenia  . Mental illness Paternal Uncle     bipolar  . Stroke Neg Hx     Social History History  Substance Use Topics  . Smoking status: Never Smoker   . Smokeless tobacco: Never Used  . Alcohol Use: No     Comment: Occasional    Review of Systems  Constitutional: Negative for fever. Eyes: Negative for visual changes. ENT: Negative for sore throat Cardiovascular: Negative for chest pain. Respiratory: Negative for shortness of breath. Gastrointestinal: Negative for abdominal pain, vomiting and diarrhea. Genitourinary: Negative for dysuria. Musculoskeletal: Negative for back pain. Skin: Negative for rash. Neurological: Negative for focal weakness Psychiatric: No  anxiety  10-point ROS otherwise negative.  ____________________________________________   PHYSICAL EXAM:  VITAL SIGNS: ED Triage Vitals  Enc Vitals Group     BP 03/26/15 1911 153/100 mmHg     Pulse Rate 03/26/15 1911 101     Resp 03/26/15 1911 18     Temp 03/26/15 1911 98.3 F (36.8 C)     Temp Source 03/26/15 1911 Oral     SpO2 03/26/15 1911 98 %     Weight 03/26/15 1911 200 lb (90.719 kg)     Height 03/26/15 1911  (1.626 m)     Head Cir --      Peak Flow --      Pain Score 03/26/15 1920 10     Pain Loc --      Pain Edu? --      Excl. in GC? --      Constitutional: Alert and oriented. Well appearing and in no distress. Eyes: Conjunctivae are normal.  ENT   Head: Normocephalic and atraumatic.   Mouth/Throat: Mucous membranes are moist. Cardiovascular:  Normal rate, regular rhythm. Normal and symmetric distal pulses are present in all extremities. No murmurs, rubs, or gallops. Respiratory: Normal respiratory effort without tachypnea nor retractions. Breath sounds are clear and equal bilaterally.  Gastrointestinal: Soft and non-tender in all quadrants. No distention. There is no CVA tenderness. Genitourinary: deferred Musculoskeletal: Nontender with normal range of motion in all extremities. No lower extremity tenderness nor edema. Neurologic:  Normal speech and language. No gross focal neurologic deficits are appreciated. Skin:  Skin is warm, dry and intact. No rash noted. Psychiatric: Mood and affect are normal. Patient exhibits appropriate insight and judgment.  ____________________________________________    LABS (pertinent positives/negatives)  Labs Reviewed  CBC  COMPREHENSIVE METABOLIC PANEL    ____________________________________________   EKG  None  ____________________________________________    RADIOLOGY I have personally reviewed any xrays that were ordered on this patient: None  ____________________________________________   PROCEDURES  Procedure(s) performed: none  Critical Care performed:none  ____________________________________________   INITIAL IMPRESSION / ASSESSMENT AND PLAN / ED COURSE  Pertinent labs & imaging results that were available during my care of the patient were reviewed by me and considered in my medical decision making (see chart for details).  Patient with symptoms consistent with her typical headache. We will do IV fluids, Toradol, Reglan and Benadryl IV. And reevaluate.   10 PM: Patient reports significant improvement in headache she is down to a 2 out of 10. We will discharge her with PCP follow-up  ____________________________________________   FINAL CLINICAL IMPRESSION(S) / ED DIAGNOSES  Final diagnoses:  Other migraine without status migrainosus, not intractable      Jene Every, MD 03/26/15 2309

## 2015-03-26 NOTE — ED Notes (Signed)
MD Kinner at bedside  

## 2015-03-26 NOTE — ED Provider Notes (Signed)
[facility] Emergency Department Provider Note  ____________________________________________  Time seen: Approximately 2:04 PM  I have reviewed the triage vital signs and the nursing notes.   HISTORY  Chief Complaint Migraine    HPI Makayla Duke is a 39 y.o. female who presents with a history of migraines complaints of a headache today which started approximately 14 hours ago. Unable to keep Imitrex down secondary to nausea and vomiting.Describes pain as 10 over 10 hours this is not the worst headache of her life.  Past Medical History  Diagnosis Date  . Childhood asthma   . Tension headache   . Elevated blood pressure reading without diagnosis of hypertension   . Migraines   . Panic attacks   . Sleep apnea   . Cervicalgia     Dion Saucier)    Patient Active Problem List   Diagnosis Date Noted  . Cervical high risk HPV (human papillomavirus) test positive 09/22/2014  . Psoriasis 09/20/2014  . Migraine 07/05/2011  . Tension headache   . Panic attacks     Past Surgical History  Procedure Laterality Date  . Cesarean section  2007; 2009    Current Outpatient Rx  Name  Route  Sig  Dispense  Refill  . acetaminophen (TYLENOL) 500 MG tablet   Oral   Take 500 mg by mouth every 6 (six) hours as needed.         . promethazine (PHENERGAN) 25 MG tablet   Oral   Take 1 tablet (25 mg total) by mouth every 6 (six) hours as needed for nausea or vomiting.   15 tablet   0   . sertraline (ZOLOFT) 50 MG tablet   Oral   Take 50 mg by mouth daily.         . SUMAtriptan (IMITREX) 50 MG tablet      TAKE AS DIRECTED   10 tablet   0     plz schedule office visit prior to more refills.     Allergies Review of patient's allergies indicates no known allergies.  Family History  Problem Relation Age of Onset  . Hypertension Father   . Coronary artery disease Father 47    MI  . Arthritis Mother   . Migraines Mother   . Psoriasis Mother   .  Breast cancer Paternal Aunt   . Cancer Paternal Aunt     breast  . Lung cancer Maternal Grandfather   . Cancer Maternal Grandfather 60    lung, smoker  . Hypertension Maternal Uncle   . Diabetes    . Lung cancer Paternal Grandmother   . Cancer Paternal Grandmother 40    lung, smoker  . Mental illness Paternal Grandmother     schizophrenia  . Mental illness Paternal Uncle     bipolar  . Stroke Neg Hx     Social History History  Substance Use Topics  . Smoking status: Never Smoker   . Smokeless tobacco: Never Used  . Alcohol Use: No     Comment: Occasional    Review of Systems Constitutional: No fever/chills Eyes: No visual changes. ENT: No sore throat. Positive for headache Cardiovascular: Denies chest pain. Respiratory: Denies shortness of breath. Gastrointestinal: No abdominal pain.  No nausea, no vomiting.  No diarrhea.  No constipation. Genitourinary: Negative for dysuria. Musculoskeletal: Negative for back pain. Skin: Negative for rash. Neurological: Positive for headaches, denies any focal weakness or numbness.  10-point ROS otherwise negative.  ____________________________________________   PHYSICAL EXAM:  VITAL SIGNS: ED Triage Vitals  Enc Vitals Group     BP 03/26/15 1356 146/91 mmHg     Pulse Rate 03/26/15 1356 95     Resp 03/26/15 1356 18     Temp 03/26/15 1356 97.8 F (36.6 C)     Temp Source 03/26/15 1356 Oral     SpO2 03/26/15 1356 98 %     Weight 03/26/15 1356 200 lb (90.719 kg)     Height 03/26/15 1356 5\' 4"  (1.626 m)     Head Cir --      Peak Flow --      Pain Score 03/26/15 1355 10     Pain Loc --      Pain Edu? --      Excl. in GC? --     Constitutional: Alert and oriented. Well appearing and in no acute distress. Eyes: Conjunctivae are normal. PERRL. EOMI. Head: Atraumatic. Nose: No congestion/rhinnorhea. Mouth/Throat: Mucous membranes are moist.  Oropharynx non-erythematous. Neck: No stridor.   Cardiovascular: Normal rate,  regular rhythm. Grossly normal heart sounds.  Good peripheral circulation. Respiratory: Normal respiratory effort.  No retractions. Lungs CTAB. Musculoskeletal: No lower extremity tenderness nor edema.  No joint effusions. Neurologic:  Normal speech and language. No gross focal neurologic deficits are appreciated.  Skin:  Skin is warm, dry and intact. No rash noted. Psychiatric: Mood and affect are normal. Speech and behavior are normal.  ____________________________________________   LABS (all labs ordered are listed, but only abnormal results are displayed)  Labs Reviewed - No data to display ____________________________________________   PROCEDURES  Procedure(s) performed: None  Critical Care performed: No  ____________________________________________   INITIAL IMPRESSION / ASSESSMENT AND PLAN / ED COURSE  Pertinent labs & imaging results that were available during my care of the patient were reviewed by me and considered in my medical decision making (see chart for details).  Acute migraine headache. Demerol Phenergan given while in the ER with improvement in pain. Rx given for Phenergan 25 mg as needed for nausea. Patient follow-up with her PCP or return to the ER with any worsening symptomology. ____________________________________________   FINAL CLINICAL IMPRESSION(S) / ED DIAGNOSES  Final diagnoses:  Nonintractable migraine, unspecified migraine type      Evangeline Dakinharles M Beers, PA-C 03/26/15 1435  Jene Everyobert Kinner, MD 03/31/15 [phone]

## 2015-03-26 NOTE — ED Notes (Signed)
Pt was seen in [facility] today for migraine and treated with nausea and pain mediation with no relief. Pt states she went home and vomited 2 more times. Was sent with nausea medication prescription but has not had a chance to get if filled yet.

## 2015-03-26 NOTE — Discharge Instructions (Signed)

## 2015-03-26 NOTE — Discharge Instructions (Signed)
Recurrent Migraine Headache A migraine headache is an intense, throbbing pain on one or both sides of your head. Recurrent migraines keep coming back. A migraine can last for 30 minutes to several hours. CAUSES  The exact cause of a migraine headache is not always known. However, a migraine may be caused when nerves in the brain become irritated and release chemicals that cause inflammation. This causes pain. Certain things may also trigger migraines, such as:   Alcohol.  Smoking.  Stress.  Menstruation.  Aged cheeses.  Foods or drinks that contain nitrates, glutamate, aspartame, or tyramine.  Lack of sleep.  Chocolate.  Caffeine.  Hunger.  Physical exertion.  Fatigue.  Medicines used to treat chest pain (nitroglycerine), birth control pills, estrogen, and some blood pressure medicines. SYMPTOMS   Pain on one or both sides of your head.  Pulsating or throbbing pain.  Severe pain that prevents daily activities.  Pain that is aggravated by any physical activity.  Nausea, vomiting, or both.  Dizziness.  Pain with exposure to bright lights, loud noises, or activity.  General sensitivity to bright lights, loud noises, or smells. Before you get a migraine, you may get warning signs that a migraine is coming (aura). An aura may include:  Seeing flashing lights.  Seeing bright spots, halos, or zigzag lines.  Having tunnel vision or blurred vision.  Having feelings of numbness or tingling.  Having trouble talking.  Having muscle weakness. DIAGNOSIS  A recurrent migraine headache is often diagnosed based on:  Symptoms.  Physical examination.  A CT scan or MRI of your head. These imaging tests cannot diagnose migraines but can help rule out other causes of headaches.  TREATMENT  Medicines may be given for pain and nausea. Medicines can also be given to help prevent recurrent migraines. HOME CARE INSTRUCTIONS  Only take over-the-counter or prescription  medicines for pain or discomfort as directed by your health care provider. The use of long-term narcotics is not recommended.  Lie down in a dark, quiet room when you have a migraine.  Keep a journal to find out what may trigger your migraine headaches. For example, write down:  What you eat and drink.  How much sleep you get.  Any change to your diet or medicines.  Limit alcohol consumption.  Quit smoking if you smoke.  Get 7-9 hours of sleep, or as recommended by your health care provider.  Limit stress.  Keep lights dim if bright lights bother you and make your migraines worse. SEEK MEDICAL CARE IF:   You do not get relief from the medicines given to you.  You have a recurrence of pain.  You have a fever. SEEK IMMEDIATE MEDICAL CARE IF:  Your migraine becomes severe.  You have a stiff neck.  You have loss of vision.  You have muscular weakness or loss of muscle control.  You start losing your balance or have trouble walking.  You feel faint or pass out.  You have severe symptoms that are different from your first symptoms. MAKE SURE YOU:   Understand these instructions.  Will watch your condition.  Will get help right away if you are not doing well or get worse. Document Released: 05/21/2001 Document Revised: 01/10/2014 Document Reviewed: 05/03/2013 [employer] Patient Information 2015 [employer]. This information is not intended to replace advice given to you by your health care provider. Make sure you discuss any questions you have with your health care provider.  

## 2015-04-18 ENCOUNTER — Encounter: Payer: Self-pay | Admitting: Emergency Medicine

## 2015-04-18 ENCOUNTER — Ambulatory Visit
Admission: EM | Admit: 2015-04-18 | Discharge: 2015-04-18 | Disposition: A | Discharge status: Home / Self Care | Payer: Medicaid Other | Attending: Family Medicine | Admitting: Family Medicine

## 2015-04-18 DIAGNOSIS — J029 Acute pharyngitis, unspecified: Secondary | ICD-10-CM | POA: Diagnosis present

## 2015-04-18 DIAGNOSIS — J40 Bronchitis, not specified as acute or chronic: Secondary | ICD-10-CM

## 2015-04-18 DIAGNOSIS — L409 Psoriasis, unspecified: Secondary | ICD-10-CM | POA: Diagnosis not present

## 2015-04-18 DIAGNOSIS — J312 Chronic pharyngitis: Secondary | ICD-10-CM | POA: Diagnosis not present

## 2015-04-18 DIAGNOSIS — H9202 Otalgia, left ear: Secondary | ICD-10-CM | POA: Diagnosis present

## 2015-04-18 HISTORY — DX: Psoriasis, unspecified: L40.9

## 2015-04-18 LAB — RAPID STREP SCREEN (MED CTR MEBANE ONLY): STREPTOCOCCUS, GROUP A SCREEN (DIRECT): NEGATIVE

## 2015-04-18 MED ORDER — AMOXICILLIN-POT CLAVULANATE 875-125 MG PO TABS: 1.0000 | ORAL_TABLET | Freq: Two times a day (BID) | ORAL | Status: DC

## 2015-04-18 MED ORDER — HYDROCOD POLST-CPM POLST ER 10-8 MG/5ML PO SUER: 5.0000 mL | Freq: Two times a day (BID) | ORAL | Status: DC

## 2015-04-18 NOTE — ED Provider Notes (Signed)
CSN: [id number]     Arrival date & time 04/18/15  1542 History   First MD Initiated Contact with Patient 04/18/15 1628     Chief Complaint  Patient presents with  . Sore Throat  . Otalgia    left ear  . Cough   (Consider location/radiation/quality/duration/timing/severity/associated sxs/prior Treatment) HPI   This a 39 year old female who presents with a two-week history of right ear pain extending into her and distally into her neck with a cough productive of dark green sputum chills and a sore throat. She states that she is on Humira for psoriasis and has noticed that while being on the medication had numerous infections. This is her third sore throat in 5 months. She doesn't know if she actually had a fever he certainly describes chills she had yesterday and today. Her cough is interfering with her sleep. He states that the cough is more dry than productive but she still is having the green sputum and coughing all night.  Past Medical History  Diagnosis Date  . Childhood asthma   . Tension headache   . Elevated blood pressure reading without diagnosis of hypertension   . Migraines   . Panic attacks   . Sleep apnea   . Cervicalgia     ([place])  . Psoriasis    Past Surgical History  Procedure Laterality Date  . Cesarean section  2007; 2009   Family History  Problem Relation Age of Onset  . Hypertension Father   . Coronary artery disease Father 67    MI  . Arthritis Mother   . Migraines Mother   . Psoriasis Mother   . Breast cancer Paternal Aunt   . Cancer Paternal Aunt     breast  . Lung cancer Maternal Grandfather   . Cancer Maternal Grandfather 60    lung, smoker  . Hypertension Maternal Uncle   . Diabetes    . Lung cancer Paternal Grandmother   . Cancer Paternal Grandmother 40    lung, smoker  . Mental illness Paternal Grandmother     schizophrenia  . Mental illness Paternal Uncle     bipolar  . Stroke Neg Hx    History  Substance Use Topics  . Smoking  status: Never Smoker   . Smokeless tobacco: Never Used  . Alcohol Use: No     Comment: Occasional   OB History    Gravida Para Term Preterm AB TAB SAB Ectopic Multiple Living   Review of Systems  Constitutional: Positive for chills and fatigue.  HENT: Positive for congestion, ear pain, postnasal drip and sore throat.   Respiratory: Positive for choking.   Musculoskeletal: Positive for arthralgias.  All other systems reviewed and are negative.   Allergies  Review of patient's allergies indicates no known allergies.  Home Medications   Prior to Admission medications   Medication Sig Start Date End Date Taking? Authorizing Provider  Adalimumab (HUMIRA PEN-CROHNS STARTER) 40 MG/0.8ML PNKT Inject 40 mg into the skin every 7 (seven) days.   Yes Historical Provider, MD  acetaminophen (TYLENOL) 500 MG tablet Take 500 mg by mouth every 6 (six) hours as needed.    Historical Provider, MD  amoxicillin-clavulanate (AUGMENTIN) 875-125 MG per tablet Take 1 tablet by mouth every 12 (twelve) hours. 04/18/15   Lutricia Feil, PA-C  butalbital-acetaminophen-caffeine (FIORICET) [phone] MG per tablet Take 1-2 tablets by mouth every 6 (six) hours as needed  for headache. 03/26/15 03/25/16  Jene Every, MD  chlorpheniramine-HYDROcodone [facility]) 10-8 MG/5ML SUER Take 5 mLs by mouth 2 (two) times daily. 04/18/15   Lutricia Feil, PA-C  promethazine (PHENERGAN) 25 MG tablet Take 1 tablet (25 mg total) by mouth every 6 (six) hours as needed for nausea or vomiting. 03/26/15   Evangeline Dakin, PA-C  sertraline (ZOLOFT) 50 MG tablet Take 50 mg by mouth daily.    Historical Provider, MD  SUMAtriptan (IMITREX) 50 MG tablet TAKE AS DIRECTED 03/26/15   Jene Every, MD   BP 128/84 mmHg  Pulse 94  Temp(Src) 98.1 F (36.7 C) (Oral)  Resp 16  Ht 5\' 4"  (1.626 m)  Wt 201 lb (91.173 kg)  BMI 34.48 kg/m2  SpO2 98%  LMP 04/03/2015 (Exact Date) Physical Exam  Constitutional:  She is oriented to person, place, and time. She appears well-developed and well-nourished.  HENT:  Head: Normocephalic and atraumatic.  Right Ear: External ear normal.  Left Ear: External ear normal.  Examination of the pharynx shows swelling of the tonsils bilaterally. Cobblestone appearance. Does have exudate seen on the right tonsillar pillar  Eyes: Pupils are equal, round, and reactive to light. Right eye exhibits no discharge. Left eye exhibits no discharge.  Neck: Neck supple. No thyromegaly present.  Pulmonary/Chest: Breath sounds normal. No stridor. No respiratory distress. She has no wheezes. She has no rales.  Musculoskeletal: Normal range of motion.  Lymphadenopathy:    She has no cervical adenopathy.  Neurological: She is alert and oriented to person, place, and time.  Skin: Skin is warm and dry.  Psychiatric: She has a normal mood and affect. Her behavior is normal. Judgment and thought content normal.  Nursing note and vitals reviewed.   ED Course  Procedures (including critical care time) Labs Review Labs Reviewed  RAPID STREP SCREEN (NOT AT [facility])  CULTURE, GROUP A STREP ([facility] ONLY)    Imaging Review No results found.   MDM   1. Pharyngitis, chronic   2. Bronchitis    New Prescriptions   AMOXICILLIN-CLAVULANATE (AUGMENTIN) 875-125 MG PER TABLET    Take 1 tablet by mouth every 12 (twelve) hours.   CHLORPHENIRAMINE-HYDROCODONE (TUSSIONEX PENNKINETIC ER) 10-8 MG/5ML SUER    Take 5 mLs by mouth 2 (two) times daily.  Plan: 1. Test/x-ray results and diagnosis reviewed with patient 2. rx as per orders; risks, benefits, potential side effects reviewed with patient 3. Recommend supportive treatment with Flonase for 1 month. Salt water gargles PRN. 4. F/u prn if symptoms worsen or don't improve   Lutricia Feil, PA-C 04/18/15 1723

## 2015-04-18 NOTE — ED Notes (Signed)
Patient c/o sore throat, left ear pain, and cough for 2 weeks.

## 2015-04-18 NOTE — Discharge Instructions (Signed)

## 2015-04-20 LAB — CULTURE, GROUP A STREP (THRC)

## 2015-09-25 ENCOUNTER — Encounter: Payer: Self-pay | Admitting: Emergency Medicine

## 2015-09-25 ENCOUNTER — Emergency Department
Admission: EM | Admit: 2015-09-25 | Discharge: 2015-09-25 | Disposition: A | Discharge status: Home / Self Care | Payer: Medicaid Other | Attending: Emergency Medicine | Admitting: Emergency Medicine

## 2015-09-25 DIAGNOSIS — J020 Streptococcal pharyngitis: Secondary | ICD-10-CM

## 2015-09-25 DIAGNOSIS — Z792 Long term (current) use of antibiotics: Secondary | ICD-10-CM | POA: Insufficient documentation

## 2015-09-25 DIAGNOSIS — J45909 Unspecified asthma, uncomplicated: Secondary | ICD-10-CM | POA: Insufficient documentation

## 2015-09-25 DIAGNOSIS — Z79899 Other long term (current) drug therapy: Secondary | ICD-10-CM | POA: Insufficient documentation

## 2015-09-25 MED ORDER — BENZONATATE 200 MG PO CAPS: 200.0000 mg | ORAL_CAPSULE | Freq: Three times a day (TID) | ORAL | Status: DC | PRN

## 2015-09-25 MED ORDER — MAGIC MOUTHWASH W/LIDOCAINE: 5.0000 mL | Freq: Four times a day (QID) | ORAL | Status: DC

## 2015-09-25 MED ORDER — AMOXICILLIN 875 MG PO TABS: 875.0000 mg | ORAL_TABLET | Freq: Two times a day (BID) | ORAL | Status: DC

## 2015-09-25 NOTE — ED Notes (Signed)
Co cough and sore throat for approximately one week.  Has also had chills per pt but has not checked temperature.  NAD.

## 2015-09-25 NOTE — ED Provider Notes (Signed)
[facility] Emergency Department Provider Note  ____________________________________________  Time seen: Approximately 9:36 PM  I have reviewed the triage vital signs and the nursing notes.   HISTORY  Chief Complaint Sore Throat    HPI Makayla Duke is a 40 y.o. female who presents to the emergency department complaining of sore throat, chills, swollen lymph nodes 1 week. She endorses cough that started 2 days prior. Patient has been taking ibuprofen and Tylenol for symptom control.   Past Medical History  Diagnosis Date  . Childhood asthma   . Tension headache   . Elevated blood pressure reading without diagnosis of hypertension   . Migraines   . Panic attacks   . Sleep apnea   . Cervicalgia     ([place])  . Psoriasis     Patient Active Problem List   Diagnosis Date Noted  . Cervical high risk HPV (human papillomavirus) test positive 09/22/2014  . Psoriasis 09/20/2014  . Migraine 07/05/2011  . Tension headache   . Panic attacks     Past Surgical History  Procedure Laterality Date  . Cesarean section  2007; 2009    Current Outpatient Rx  Name  Route  Sig  Dispense  Refill  . acetaminophen (TYLENOL) 500 MG tablet   Oral   Take 500 mg by mouth every 6 (six) hours as needed.         . Adalimumab (HUMIRA PEN-CROHNS STARTER) 40 MG/0.8ML PNKT   Subcutaneous   Inject 40 mg into the skin every 7 (seven) days.         Marland Kitchen amoxicillin (AMOXIL) 875 MG tablet   Oral   Take 1 tablet (875 mg total) by mouth 2 (two) times daily.   14 tablet   0   . amoxicillin-clavulanate (AUGMENTIN) 875-125 MG per tablet   Oral   Take 1 tablet by mouth every 12 (twelve) hours.   20 tablet   0   . benzonatate (TESSALON) 200 MG capsule   Oral   Take 1 capsule (200 mg total) by mouth 3 (three) times daily as needed for cough.   21 capsule   0   . butalbital-acetaminophen-caffeine (FIORICET) 50-325-40 MG per tablet   Oral   Take 1-2 tablets by  mouth every 6 (six) hours as needed for headache.   20 tablet   0   . chlorpheniramine-HYDROcodone (TUSSIONEX PENNKINETIC ER) 10-8 MG/5ML SUER   Oral   Take 5 mLs by mouth 2 (two) times daily.   140 mL   0   . magic mouthwash w/lidocaine SOLN   Oral   Take 5 mLs by mouth 4 (four) times daily.   240 mL   0     Dispense in a 1/1/1/1 ratio. Use lidocaine, diphen ...   . promethazine (PHENERGAN) 25 MG tablet   Oral   Take 1 tablet (25 mg total) by mouth every 6 (six) hours as needed for nausea or vomiting.   15 tablet   0   . sertraline (ZOLOFT) 50 MG tablet   Oral   Take 50 mg by mouth daily.         . SUMAtriptan (IMITREX) 50 MG tablet      TAKE AS DIRECTED   10 tablet   0     plz schedule office visit prior to more refills.     Allergies Review of patient's allergies indicates no known allergies.  Family History  Problem Relation Age of Onset  . Hypertension Father   .  Coronary artery disease Father 31    MI  . Arthritis Mother   . Migraines Mother   . Psoriasis Mother   . Breast cancer Paternal Aunt   . Cancer Paternal Aunt     breast  . Lung cancer Maternal Grandfather   . Cancer Maternal Grandfather 60    lung, smoker  . Hypertension Maternal Uncle   . Diabetes    . Lung cancer Paternal Grandmother   . Cancer Paternal Grandmother 40    lung, smoker  . Mental illness Paternal Grandmother     schizophrenia  . Mental illness Paternal Uncle     bipolar  . Stroke Neg Hx     Social History Social History  Substance Use Topics  . Smoking status: Never Smoker   . Smokeless tobacco: Never Used  . Alcohol Use: No     Comment: Occasional     Review of Systems  Constitutional: Positive for chills Eyes: No visual changes. No discharge ENT: Positive sore throat. Positive for exudates on tonsils. Cardiovascular: no chest pain. Respiratory: Negative for cough. No SOB. Gastrointestinal: No abdominal pain.  No nausea, no vomiting.  No diarrhea.  No  constipation. Genitourinary: Negative for dysuria. No hematuria Musculoskeletal: Negative for back pain. Skin: Negative for rash. Neurological: Negative for headaches, focal weakness or numbness. 10-point ROS otherwise negative.  ____________________________________________   PHYSICAL EXAM:  VITAL SIGNS: ED Triage Vitals  Enc Vitals Group     BP 09/25/15 2039 140/89 mmHg     Pulse Rate 09/25/15 2039 95     Resp 09/25/15 2039 18     Temp 09/25/15 2039 98.2 F (36.8 C)     Temp Source 09/25/15 2039 Oral     SpO2 09/25/15 2039 100 %     Weight 09/25/15 2039 200 lb (90.719 kg)     Height 09/25/15 2039 5\' 4"  (1.626 m)     Head Cir --      Peak Flow --      Pain Score 09/25/15 2041 6     Pain Loc --      Pain Edu? --      Excl. in GC? --      Constitutional: Alert and oriented. Well appearing and in no acute distress. Eyes: Conjunctivae are normal. PERRL. EOMI. Head: Atraumatic. ENT:      Ears: EACs and TMs are unremarkable bilaterally      Nose: No congestion/rhinnorhea.      Mouth/Throat: Mucous membranes are moist. Oropharynx is erythematous, tonsils are erythematous, edematous, with exudates. Neck: No stridor.   Hematological/Lymphatic/Immunilogical: Diffuse, whole, tender anterior cervical lymphadenopathy. Cardiovascular: Normal rate, regular rhythm. Normal S1 and S2.  Good peripheral circulation. Respiratory: Normal respiratory effort without tachypnea or retractions. Lungs CTAB. Gastrointestinal: Soft and nontender. No distention. No CVA tenderness. Musculoskeletal: No lower extremity tenderness nor edema.  No joint effusions. Neurologic:  Normal speech and language. No gross focal neurologic deficits are appreciated.  Skin:  Skin is warm, dry and intact. No rash noted. Psychiatric: Mood and affect are normal. Speech and behavior are normal. Patient exhibits appropriate insight and judgement.   ____________________________________________   LABS (all labs  ordered are listed, but only abnormal results are displayed)  Labs Reviewed - No data to display ____________________________________________  EKG   ____________________________________________  RADIOLOGY   No results found.  ____________________________________________    PROCEDURES  Procedure(s) performed:       Medications - No data to display   ____________________________________________   INITIAL  IMPRESSION / ASSESSMENT AND PLAN / ED COURSE  Pertinent labs & imaging results that were available during my care of the patient were reviewed by me and considered in my medical decision making (see chart for details).  Patient's diagnosis is consistent with her throat. Mother presented with her daughter is test positive by rapid strep test. The patient does meet 4 out of 5 centor criteria. Patient was not given a rapid strep test in the emergency department, will be treated for strep regardless. Patient does have a present with cough this time however symptoms began without cough. Only criteria mother does not meet is a tender 12.. Patient will be discharged home with prescriptions for antibiotics, mouthwash, antitussive agent. Patient is to follow up with primary care provider if symptoms persist past this treatment course. Patient is given ED precautions to return to the ED for any worsening or new symptoms.     ____________________________________________  FINAL CLINICAL IMPRESSION(S) / ED DIAGNOSES  Final diagnoses:  Strep throat      NEW MEDICATIONS STARTED DURING THIS VISIT:  New Prescriptions   AMOXICILLIN (AMOXIL) 875 MG TABLET    Take 1 tablet (875 mg total) by mouth 2 (two) times daily.   BENZONATATE (TESSALON) 200 MG CAPSULE    Take 1 capsule (200 mg total) by mouth 3 (three) times daily as needed for cough.   MAGIC MOUTHWASH W/LIDOCAINE SOLN    Take 5 mLs by mouth 4 (four) times daily.        Delorise RoyalsJonathan D Cuthriell, PA-C 09/25/15  2159  Phineas SemenGraydon Goodman, MD 09/25/15 2312

## 2015-09-25 NOTE — Discharge Instructions (Signed)

## 2015-09-26 ENCOUNTER — Telehealth: Payer: Self-pay | Admitting: Emergency Medicine

## 2015-09-26 NOTE — ED Notes (Signed)
Pt called and asking for z pack instead of amoxil because it will work better.  She also does not want the tessalon perls, she wants a liquid cough med.  Per Maceo Pro PA can cancel the amoxil and give z pack.  Can cancel the tessalon and give bromfed  10 ml every 4 hours as needed for cough with quantity of 240 ml.

## 2015-11-20 ENCOUNTER — Emergency Department
Admission: EM | Admit: 2015-11-20 | Discharge: 2015-11-21 | Disposition: A | Discharge status: Home / Self Care | Payer: Medicaid Other | Attending: Emergency Medicine | Admitting: Emergency Medicine

## 2015-11-20 ENCOUNTER — Encounter: Payer: Self-pay | Admitting: *Deleted

## 2015-11-20 DIAGNOSIS — Z792 Long term (current) use of antibiotics: Secondary | ICD-10-CM | POA: Insufficient documentation

## 2015-11-20 DIAGNOSIS — M791 Myalgia, unspecified site: Secondary | ICD-10-CM

## 2015-11-20 DIAGNOSIS — B349 Viral infection, unspecified: Secondary | ICD-10-CM | POA: Insufficient documentation

## 2015-11-20 DIAGNOSIS — Z79899 Other long term (current) drug therapy: Secondary | ICD-10-CM | POA: Insufficient documentation

## 2015-11-20 DIAGNOSIS — J45909 Unspecified asthma, uncomplicated: Secondary | ICD-10-CM | POA: Insufficient documentation

## 2015-11-20 NOTE — ED Notes (Signed)
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 States she has a bad HA put got relief from her migraine medication.

## 2015-11-20 NOTE — ED Notes (Addendum)
Mother reports body aches, fever and headache since last night.  Pt alert. Speech clear.

## 2015-11-21 LAB — RAPID INFLUENZA A&B ANTIGENS
Influenza A (ARMC): NEGATIVE
Influenza B (ARMC): NEGATIVE

## 2015-11-21 MED ORDER — ONDANSETRON 4 MG PO TBDP: 4.0000 mg | ORAL_TABLET | Freq: Three times a day (TID) | ORAL | Status: DC | PRN

## 2015-11-21 MED ORDER — IBUPROFEN 800 MG PO TABS
800.0000 mg | ORAL_TABLET | Freq: Once | ORAL | Status: AC
  Administered 2015-11-21: 800 mg via ORAL
  Filled 2015-11-21: qty 1

## 2015-11-21 MED ORDER — ONDANSETRON 4 MG PO TBDP
4.0000 mg | ORAL_TABLET | Freq: Once | ORAL | Status: AC
  Administered 2015-11-21: 4 mg via ORAL
  Filled 2015-11-21: qty 1

## 2015-11-21 NOTE — ED Provider Notes (Signed)
[facility] Emergency Department Provider Note  ____________________________________________  Time seen: Approximately 0033 AM  I have reviewed the triage vital signs and the nursing notes.   HISTORY  Chief Complaint Influenza    HPI Makayla Duke is a 40 y.o. female who comes into the hospital today with the thoughts that she may have the flu. The patient reports that she has been achy and nauseous. She reports that the symptoms have been mainly today. She reports that she could not get out of the house today to take her kids to the doctors because she just felt so bad. She's had some headache as well. The patient has been taking Tylenol, ibuprofen and NyQuil. She started having some symptoms yesterday but they were worse today. The patient has some nausea with no vomiting or diarrhea as well as body aches. She thought she may have had a fever because she felt hot but did not take her temperature. The patient denies a cough and has had some sore throat on and off for the past month. She denies any current throat pain. 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 The patient rates her pain as an 8-9 out of 10 in intensity.   Past Medical History  Diagnosis Date  . Childhood asthma   . Tension headache   . Elevated blood pressure reading without diagnosis of hypertension   . Migraines   . Panic attacks   . Sleep apnea   . Cervicalgia     ([place])  . Psoriasis     Patient Active Problem List   Diagnosis Date Noted  . Cervical high risk HPV (human papillomavirus) test positive 09/22/2014  . Psoriasis 09/20/2014  . Migraine 07/05/2011  . Tension headache   . Panic attacks     Past Surgical History  Procedure Laterality Date  . Cesarean section  2007; 2009     Current Outpatient Rx  Name  Route  Sig  Dispense  Refill  . acetaminophen (TYLENOL) 500 MG tablet   Oral   Take 500 mg by mouth every 6 (six) hours as needed.         . Adalimumab (HUMIRA PEN-CROHNS STARTER) 40 MG/0.8ML PNKT   Subcutaneous   Inject 40 mg into the skin every 7 (seven) days.         Marland Kitchen amoxicillin (AMOXIL) 875 MG tablet   Oral   Take 1 tablet (875 mg total) by mouth 2 (two) times daily.   14 tablet   0   . amoxicillin-clavulanate (AUGMENTIN) 875-125 MG per tablet   Oral   Take 1 tablet by mouth every 12 (twelve) hours.   20 tablet   0   . benzonatate (TESSALON) 200 MG capsule   Oral   Take 1 capsule (200 mg total) by mouth 3 (three) times daily as needed for cough.   21 capsule   0   . butalbital-acetaminophen-caffeine (FIORICET) 50-325-40 MG per tablet   Oral   Take 1-2 tablets by mouth every 6 (six) hours as needed for headache.   20 tablet   0   . chlorpheniramine-HYDROcodone (TUSSIONEX PENNKINETIC ER) 10-8 MG/5ML SUER   Oral   Take 5 mLs by mouth 2 (two) times daily.   140 mL   0   . magic  mouthwash w/lidocaine SOLN   Oral   Take 5 mLs by mouth 4 (four) times daily.   240 mL   0     Dispense in a 1/1/1/1 ratio. Use lidocaine, diphen ...   . ondansetron (ZOFRAN ODT) 4 MG disintegrating tablet   Oral   Take 1 tablet (4 mg total) by mouth every 8 (eight) hours as needed for nausea or vomiting.   20 tablet   0   . promethazine (PHENERGAN) 25 MG tablet   Oral   Take 1 tablet (25 mg total) by mouth every 6 (six) hours as needed for nausea or vomiting.   15 tablet   0   . sertraline (ZOLOFT) 50 MG tablet   Oral   Take 50 mg by mouth daily.         . SUMAtriptan (IMITREX) 50 MG tablet      TAKE AS DIRECTED   10 tablet   0     plz schedule office visit prior to more refills.     Allergies Review of patient's allergies indicates no known allergies.  Family History  Problem Relation Age of Onset  . Hypertension Father    . Coronary artery disease Father 14    MI  . Arthritis Mother   . Migraines Mother   . Psoriasis Mother   . Breast cancer Paternal Aunt   . Cancer Paternal Aunt     breast  . Lung cancer Maternal Grandfather   . Cancer Maternal Grandfather 60    lung, smoker  . Hypertension Maternal Uncle   . Diabetes    . Lung cancer Paternal Grandmother   . Cancer Paternal Grandmother 40    lung, smoker  . Mental illness Paternal Grandmother     schizophrenia  . Mental illness Paternal Uncle     bipolar  . Stroke Neg Hx     Social History Social History  Substance Use Topics  . Smoking status: Never Smoker   . Smokeless tobacco: Never Used  . Alcohol Use: No     Comment: Occasional    Review of Systems Constitutional: fever/chills Eyes: No visual changes. ENT: Intermittent sore throat. Cardiovascular: Denies chest pain. Respiratory: Denies shortness of breath. Gastrointestinal: Nausea without vomiting, diarrhea or abdominal pain. Genitourinary: Negative for dysuria. Musculoskeletal: Generalized body aches Skin: Negative for rash. Neurological: Negative for headaches, focal weakness or numbness.  10-point ROS otherwise negative.  ____________________________________________   PHYSICAL EXAM:  VITAL SIGNS: ED Triage Vitals  Enc Vitals Group     BP 11/20/15 2255 154/106 mmHg     Pulse Rate 11/20/15 2254 96     Resp 11/20/15 2254 18     Temp 11/20/15 2254 98.2 F (36.8 C)     Temp Source 11/20/15 2254 Oral     SpO2 11/20/15 2254 95 %     Weight 11/20/15 2254 200 lb (90.719 kg)     Height 11/20/15 2254  (1.626 m)     Head Cir --      Peak Flow --      Pain Score 11/20/15 2254 8     Pain Loc --      Pain Edu? --      Excl. in GC? --     Constitutional: Alert and oriented. Well appearing and in mild distress. Eyes: Conjunctivae are normal. PERRL. EOMI. Head: Atraumatic. Nose: No congestion/rhinnorhea. Mouth/Throat: Mucous membranes are moist.  Oropharynx  non-erythematous. Cardiovascular: Normal rate, regular rhythm. Grossly normal heart sounds.  Good peripheral circulation. Respiratory: Normal respiratory effort.  No retractions. Lungs CTAB. Gastrointestinal: Soft and nontender. No distention. Positive bowel sounds Musculoskeletal: No lower extremity tenderness nor edema.   Neurologic:  Normal speech and language.  Skin:  Skin is warm, dry and intact.  Psychiatric: Mood and affect are normal.   ____________________________________________   LABS (all labs ordered are listed, but only abnormal results are displayed)  Labs Reviewed  RAPID INFLUENZA A&B ANTIGENS ([facility] ONLY)   ____________________________________________  EKG  None ____________________________________________  RADIOLOGY  None ____________________________________________   PROCEDURES  Procedure(s) performed: None  Critical Care performed: No  ____________________________________________   INITIAL IMPRESSION / ASSESSMENT AND PLAN / ED COURSE  Pertinent labs & imaging results that were available during my care of the patient were reviewed by me and considered in my medical decision making (see chart for details).  This is a [age] year old female who comes in for hospital today with body aches, nausea and fever. The patient was concerned she may have to the flu but it is negative. The patient received some ibuprofen and some zofran. I feel that the patient has some viral illness. She will be discharged to home to follow up with her primary care physician.  ____________________________________________   FINAL CLINICAL IMPRESSION(S) / ED DIAGNOSES  Final diagnoses:  Viral illness  Myalgia      Rebecka ApleyAllison P Sheritha Louis, MD 11/21/15 [phone]

## 2015-11-21 NOTE — Discharge Instructions (Signed)
Muscle Pain, Adult °Muscle pain (myalgia) may be caused by many things, including: °· Overuse or muscle strain, especially if you are not in shape. This is the most common cause of muscle pain. °· Injury. °· Bruises. °· Viruses, such as the flu. °· Infectious diseases. °· Fibromyalgia, which is a chronic condition that causes muscle tenderness, fatigue, and headache. °· Autoimmune diseases, including lupus. °· Certain drugs, including ACE inhibitors and statins. °Muscle pain may be mild or severe. In most cases, the pain lasts only a short time and goes away without treatment. To diagnose the cause of your muscle pain, your health care provider will take your medical history. This means he or she will ask you when your muscle pain began and what has been happening. If you have not had muscle pain for very long, your health care provider may want to wait before doing much testing. If your muscle pain has lasted a long time, your health care provider may want to run tests right away. If your health care provider thinks your muscle pain may be caused by illness, you may need to have additional tests to rule out certain conditions.  °Treatment for muscle pain depends on the cause. Home care is often enough to relieve muscle pain. Your health care provider may also prescribe anti-inflammatory medicine. °HOME CARE INSTRUCTIONS °Watch your condition for any changes. The following actions may help to lessen any discomfort you are feeling: °· Only take over-the-counter or prescription medicines as directed by your health care provider. °· Apply ice to the sore muscle: °· Put ice in a plastic bag. °· Place a towel between your skin and the bag. °· Leave the ice on for 15-20 minutes, 3-4 times a day. °· You may alternate applying hot and cold packs to the muscle as directed by your health care provider. °· If overuse is causing your muscle pain, slow down your activities until the pain goes away. °· Remember that it is normal  to feel some muscle pain after starting a workout program. Muscles that have not been used often will be sore at first. °· Do regular, gentle exercises if you are not usually active. °· Warm up before exercising to lower your risk of muscle pain. °· Do not continue working out if the pain is very bad. Bad pain could mean you have injured a muscle. °SEEK MEDICAL CARE IF: °· Your muscle pain gets worse, and medicines do not help. °· You have muscle pain that lasts longer than 3 days. °· You have a rash or fever along with muscle pain. °· You have muscle pain after a tick bite. °· You have muscle pain while working out, even though you are in good physical condition. °· You have redness, soreness, or swelling along with muscle pain. °· You have muscle pain after starting a new medicine or changing the dose of a medicine. °SEEK IMMEDIATE MEDICAL CARE IF: °· You have trouble breathing. °· You have trouble swallowing. °· You have muscle pain along with a stiff neck, fever, and vomiting. °· You have severe muscle weakness or cannot move part of your body. °MAKE SURE YOU:  °· Understand these instructions. °· Will watch your condition. °· Will get help right away if you are not doing well or get worse. °  °This information is not intended to replace advice given to you by your health care provider. Make sure you discuss any questions you have with your health care provider. °  °Document Released:   07/18/2006 Document Revised: 09/16/2014 Document Reviewed: 06/22/2013 Elsevier Interactive Patient Education 2016 [employer]  Viral Infections A virus is a type of germ. Viruses can cause:  Minor sore throats.  Aches and pains.  Headaches.  Runny nose.  Rashes.  Watery eyes.  Tiredness.  Coughs.  Loss of appetite.  Feeling sick to your stomach (nausea).  Throwing up (vomiting).  Watery poop (diarrhea). HOME CARE   Only take medicines as told by your doctor.  Drink enough water and fluids to keep  your pee (urine) clear or pale yellow. Sports drinks are a good choice.  Get plenty of rest and eat healthy. Soups and broths with crackers or rice are fine. GET HELP RIGHT AWAY IF:   You have a very bad headache.  You have shortness of breath.  You have chest pain or neck pain.  You have an unusual rash.  You cannot stop throwing up.  You have watery poop that does not stop.  You cannot keep fluids down.  You or your child has a temperature by mouth above 102 F (38.9 C), not controlled by medicine.  Your baby is older than 3 months with a rectal temperature of 102 F (38.9 C) or higher.  Your baby is 643 months old or younger with a rectal temperature of 100.4 F (38 C) or higher. MAKE SURE YOU:   Understand these instructions.  Will watch this condition.  Will get help right away if you are not doing well or get worse.   This information is not intended to replace advice given to you by your health care provider. Make sure you discuss any questions you have with your health care provider.   Document Released: 08/08/2008 Document Revised: 11/18/2011 Document Reviewed: 02/01/2015 Elsevier Interactive Patient Education [employer] Elsevier Inc.

## 2015-12-14 IMAGING — CR DG HAND COMPLETE 3+V*L*
3 series · 3 of 3 positions shown · non-contrast
Comparison: None.

CLINICAL DATA: Bilateral hand pain. Swelling. Changes are chronic.
No known injury.

EXAM:
LEFT HAND - COMPLETE 3+ VIEW

[hand ap]
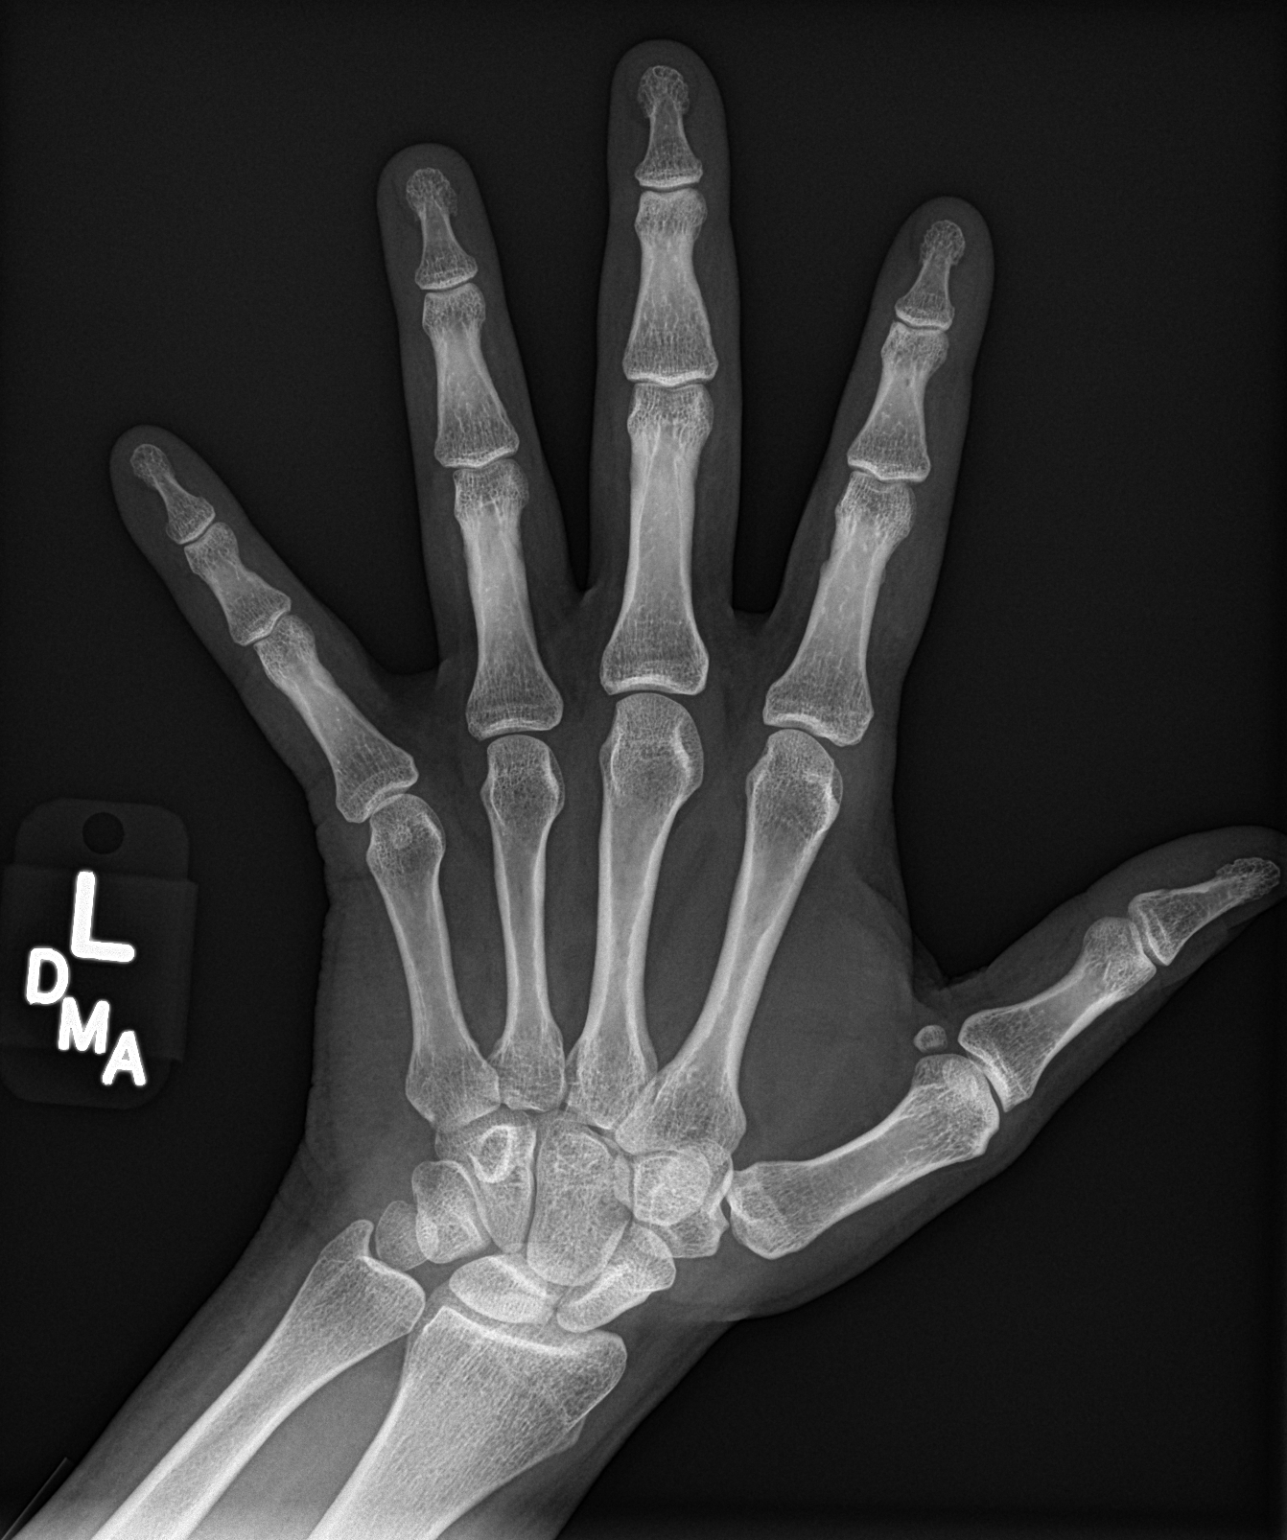

[hand obl]
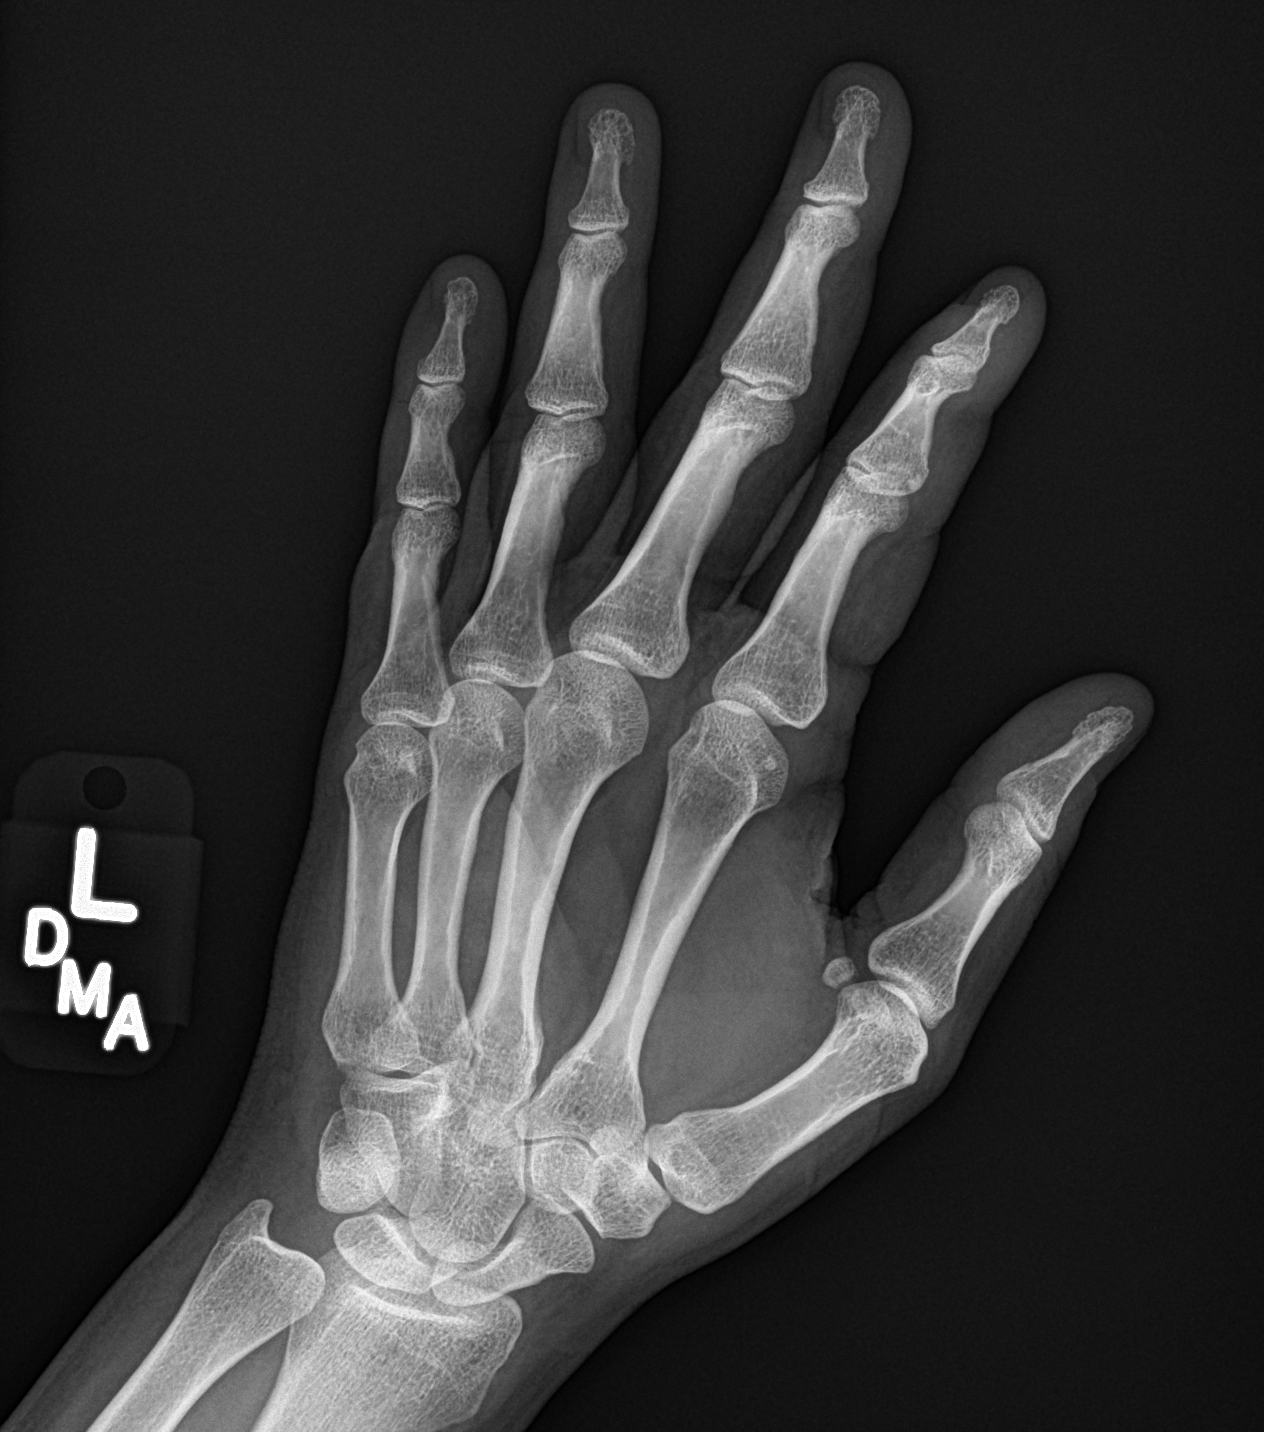

[hand lat]
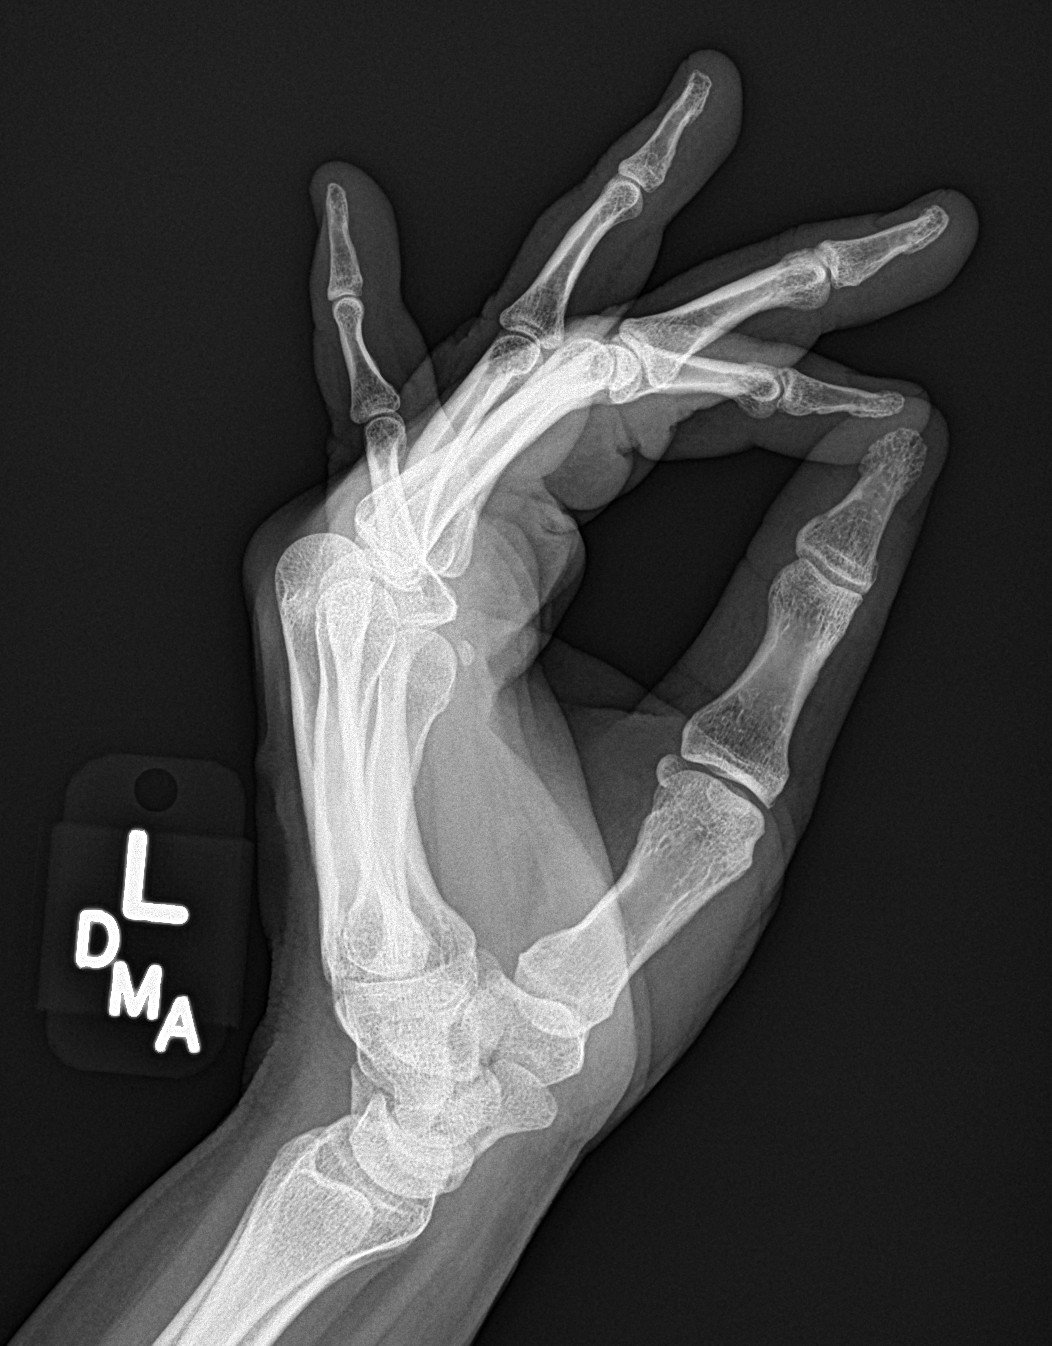

[3 of 3 positions shown; findings below may reference images not displayed]

FINDINGS: There is no evidence of fracture or dislocation. There is no
evidence of arthropathy or other focal bone abnormality. Soft
tissues are unremarkable.
IMPRESSION: Negative.

## 2015-12-14 IMAGING — CR RIGHT HAND - COMPLETE 3+ VIEW
3 series · 3 of 3 positions shown · non-contrast
Comparison: None.

CLINICAL DATA: Eight month history of bilateral hand pain and spasm
and swelling

EXAM:
RIGHT HAND - COMPLETE 3+ VIEW

[hand ap]
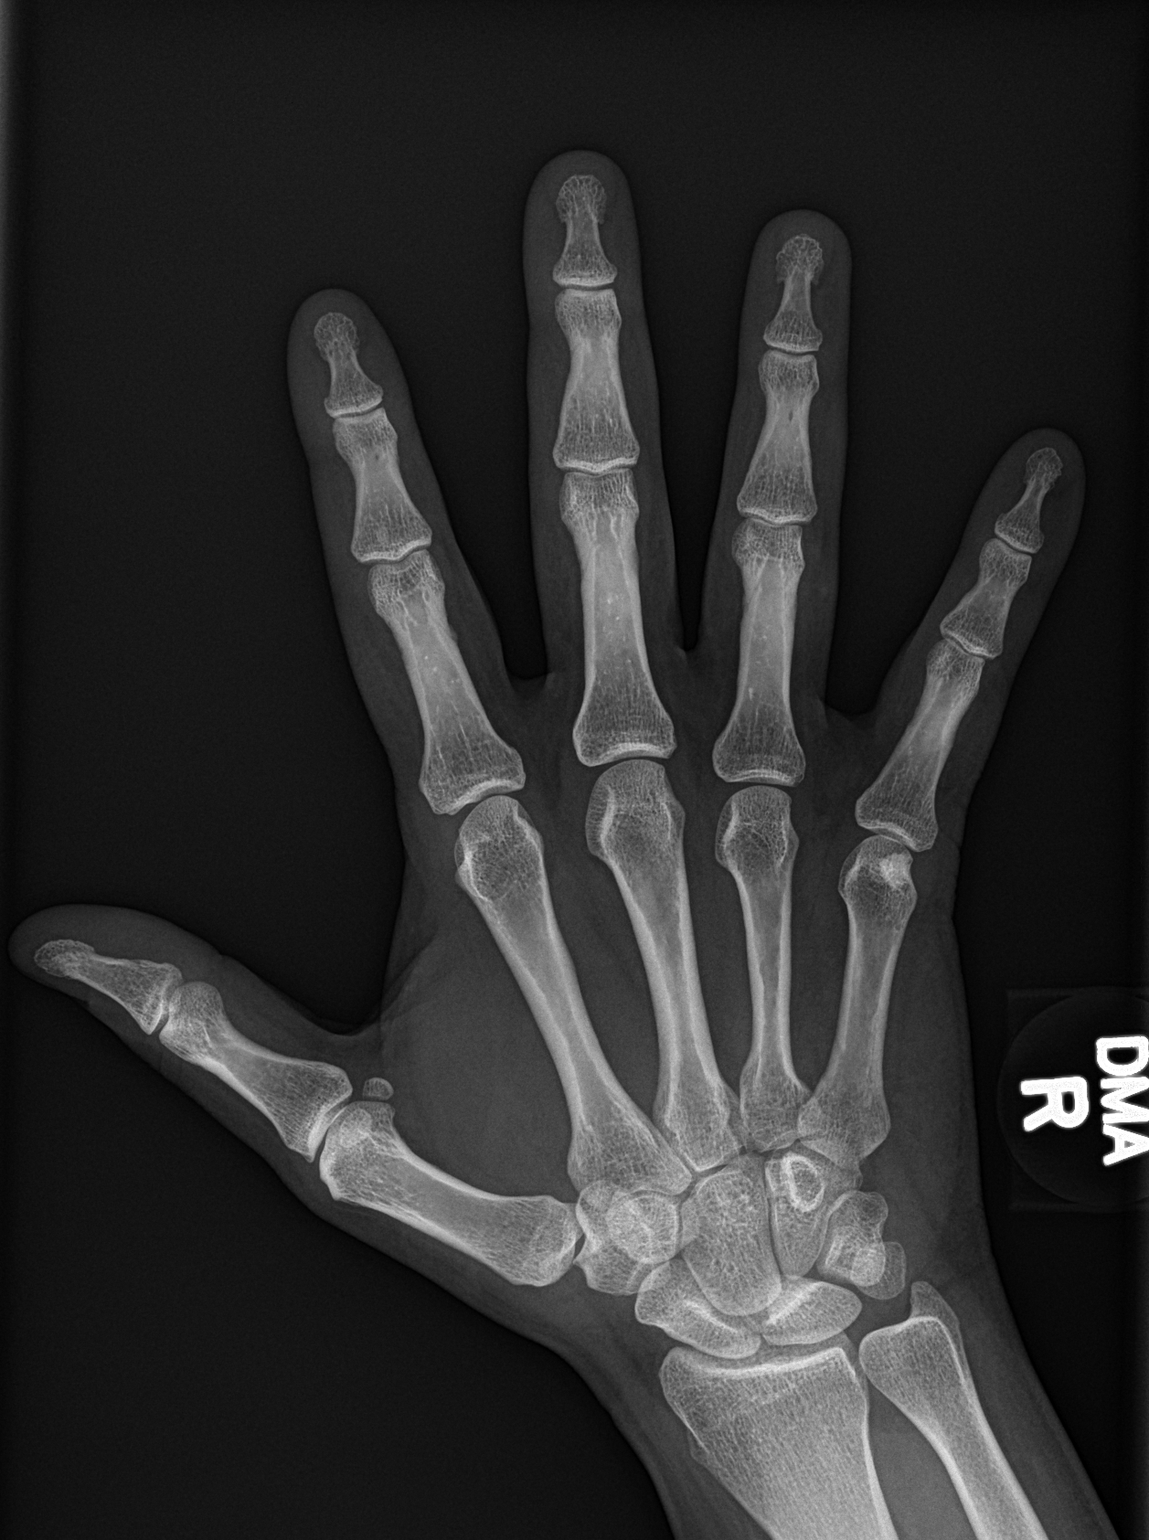

[hand obl]
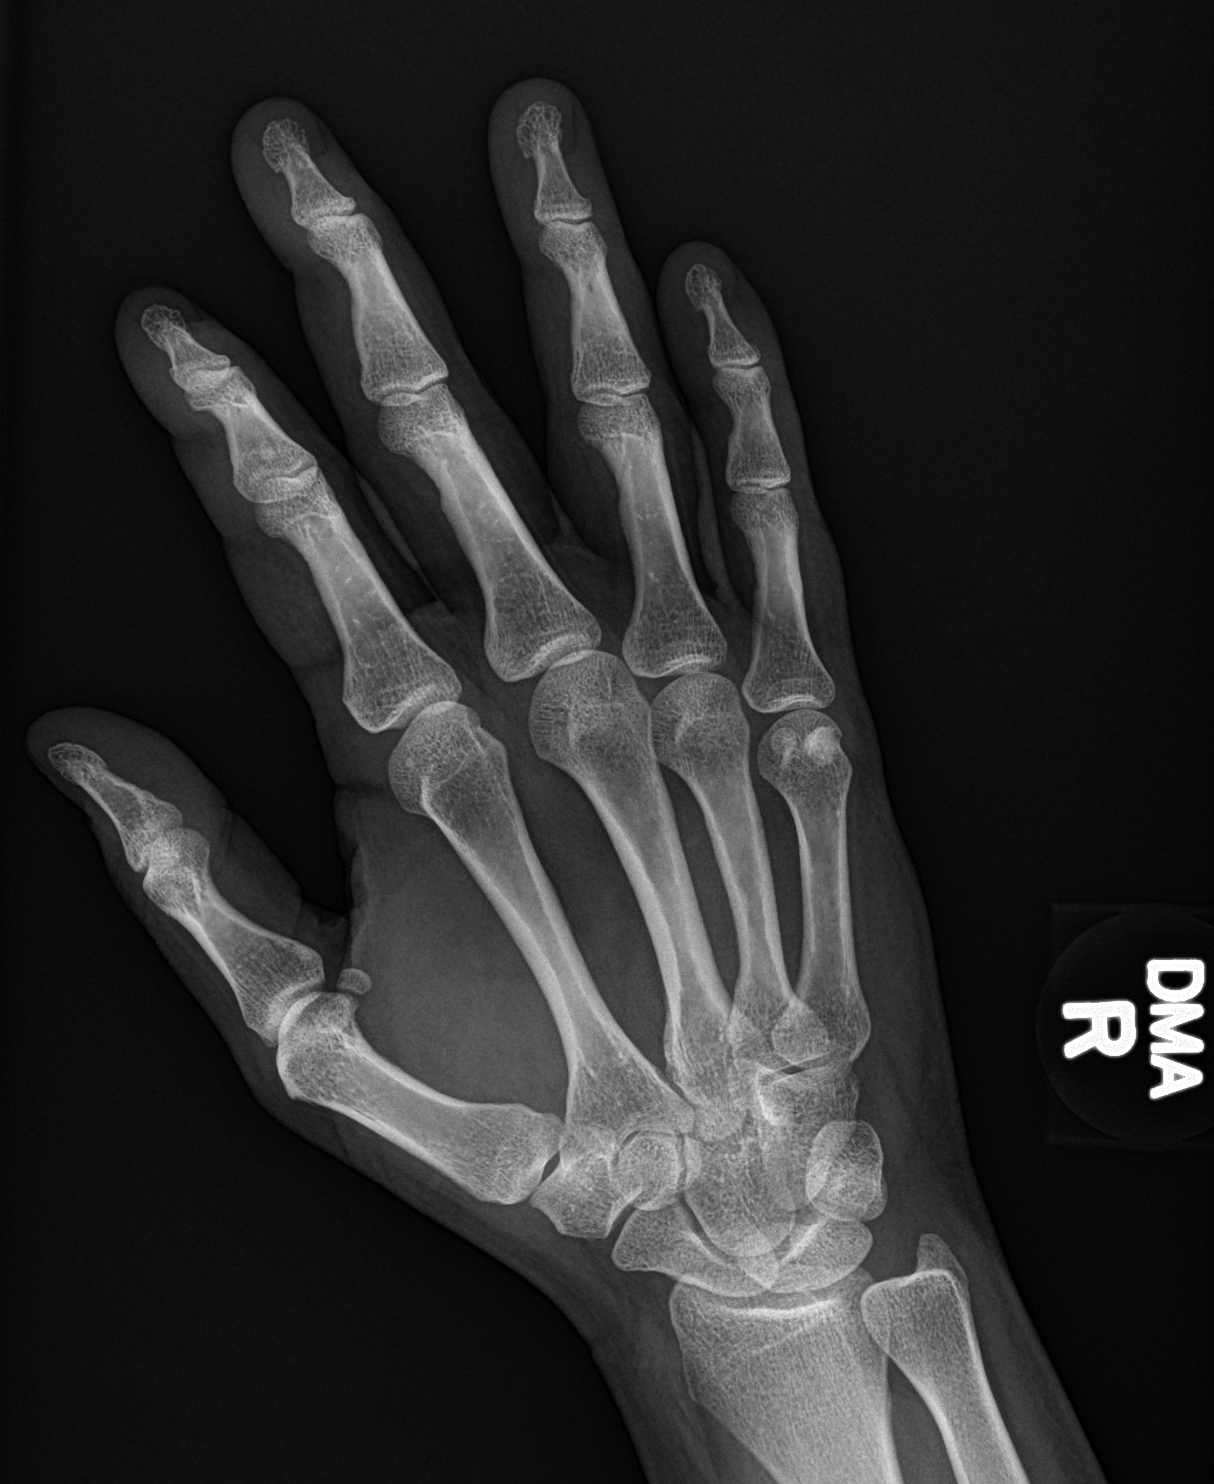

[hand lat]
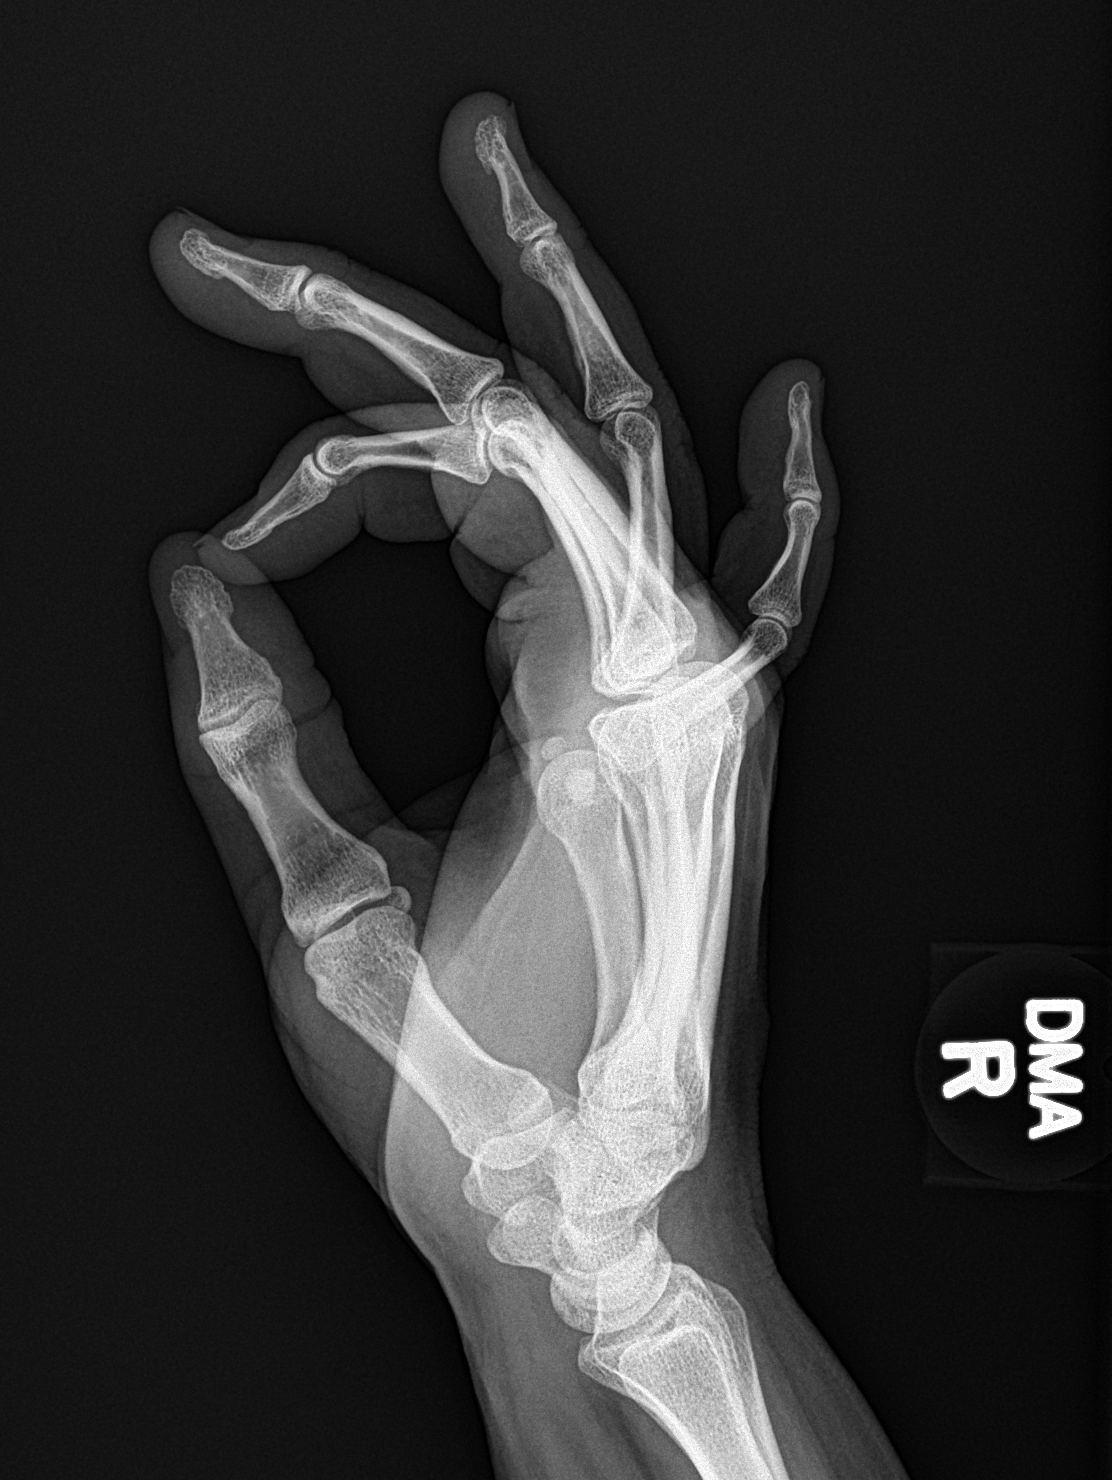

[3 of 3 positions shown; findings below may reference images not displayed]

FINDINGS: The bones of the right hand are adequately mineralized. There is no
fracture nor dislocation. There is no lytic lesion or periosteal
reaction. The interphalangeal, metacarpophalangeal, and
carpometacarpal joints are unremarkable. There is a bone island in
the head of the fifth metacarpal. The carpal bones and distal radius
and ulna are unremarkable where visualized. The soft tissues of the
hand are unremarkable.
IMPRESSION: There is no acute bony abnormality of the right hand nor evidence of
significant degenerative change.

## 2015-12-26 ENCOUNTER — Emergency Department
Admission: EM | Admit: 2015-12-26 | Discharge: 2015-12-26 | Disposition: A | Discharge status: Home / Self Care | Payer: Self-pay | Attending: Emergency Medicine | Admitting: Emergency Medicine

## 2015-12-26 DIAGNOSIS — J069 Acute upper respiratory infection, unspecified: Secondary | ICD-10-CM | POA: Insufficient documentation

## 2015-12-26 DIAGNOSIS — Z79899 Other long term (current) drug therapy: Secondary | ICD-10-CM | POA: Insufficient documentation

## 2015-12-26 DIAGNOSIS — J45909 Unspecified asthma, uncomplicated: Secondary | ICD-10-CM | POA: Insufficient documentation

## 2015-12-26 DIAGNOSIS — K120 Recurrent oral aphthae: Secondary | ICD-10-CM | POA: Insufficient documentation

## 2015-12-26 MED ORDER — LORATADINE 10 MG PO TABS
10.0000 mg | ORAL_TABLET | Freq: Every day | ORAL | Status: DC
Start: 2015-12-26 — End: 2017-02-11

## 2015-12-26 MED ORDER — PROMETHAZINE-DM 6.25-15 MG/5ML PO SYRP: 5.0000 mL | ORAL_SOLUTION | Freq: Four times a day (QID) | ORAL | Status: DC | PRN

## 2015-12-26 MED ORDER — AMOXICILLIN 875 MG PO TABS: 875.0000 mg | ORAL_TABLET | Freq: Two times a day (BID) | ORAL | Status: DC

## 2015-12-26 MED ORDER — MAGIC MOUTHWASH: 5.0000 mL | Freq: Four times a day (QID) | ORAL | Status: DC | PRN

## 2015-12-26 NOTE — ED Notes (Signed)
Pt discharged to home.  Family member driving.  Discharge instructions reviewed.  Verbalized understanding.  No questions or concerns at this time.  Teach back verified.  Pt in NAD.  No items left in ED.   

## 2015-12-26 NOTE — Discharge Instructions (Signed)
Upper Respiratory Infection, Adult Most upper respiratory infections (URIs) are a viral infection of the air passages leading to the lungs. A URI affects the nose, throat, and upper air passages. The most common type of URI is nasopharyngitis and is typically referred to as "the common cold." URIs run their course and usually go away on their own. Most of the time, a URI does not require medical attention, but sometimes a bacterial infection in the upper airways can follow a viral infection. This is called a secondary infection. Sinus and middle ear infections are common types of secondary upper respiratory infections. Bacterial pneumonia can also complicate a URI. A URI can worsen asthma and chronic obstructive pulmonary disease (COPD). Sometimes, these complications can require emergency medical care and may be life threatening.  CAUSES Almost all URIs are caused by viruses. A virus is a type of germ and can spread from one person to another.  RISKS FACTORS You may be at risk for a URI if:   You smoke.   You have chronic heart or lung disease.  You have a weakened defense (immune) system.   You are very young or very old.   You have nasal allergies or asthma.  You work in crowded or poorly ventilated areas.  You work in health care facilities or schools. SIGNS AND SYMPTOMS  Symptoms typically develop 2-3 days after you come in contact with a cold virus. Most viral URIs last 7-10 days. However, viral URIs from the influenza virus (flu virus) can last 14-18 days and are typically more severe. Symptoms may include:   Runny or stuffy (congested) nose.   Sneezing.   Cough.   Sore throat.   Headache.   Fatigue.   Fever.   Loss of appetite.   Pain in your forehead, behind your eyes, and over your cheekbones (sinus pain).  Muscle aches.  DIAGNOSIS  Your health care provider may diagnose a URI by:  Physical exam.  Tests to check that your symptoms are not due to  another condition such as:  Strep throat.  Sinusitis.  Pneumonia.  Asthma. TREATMENT  A URI goes away on its own with time. It cannot be cured with medicines, but medicines may be prescribed or recommended to relieve symptoms. Medicines may help:  Reduce your fever.  Reduce your cough.  Relieve nasal congestion. HOME CARE INSTRUCTIONS   Take medicines only as directed by your health care provider.   Gargle warm saltwater or take cough drops to comfort your throat as directed by your health care provider.  Use a warm mist humidifier or inhale steam from a shower to increase air moisture. This may make it easier to breathe.  Drink enough fluid to keep your urine clear or pale yellow.   Eat soups and other clear broths and maintain good nutrition.   Rest as needed.   Return to work when your temperature has returned to normal or as your health care provider advises. You may need to stay home longer to avoid infecting others. You can also use a face mask and careful hand washing to prevent spread of the virus.  Increase the usage of your inhaler if you have asthma.   Do not use any tobacco products, including cigarettes, chewing tobacco, or electronic cigarettes. If you need help quitting, ask your health care provider. PREVENTION  The best way to protect yourself from getting a cold is to practice good hygiene.   Avoid oral or hand contact with people with cold   symptoms.   Wash your hands often if contact occurs.  There is no clear evidence that vitamin C, vitamin E, echinacea, or exercise reduces the chance of developing a cold. However, it is always recommended to get plenty of rest, exercise, and practice good nutrition.  SEEK MEDICAL CARE IF:   You are getting worse rather than better.   Your symptoms are not controlled by medicine.   You have chills.  You have worsening shortness of breath.  You have brown or red mucus.  You have yellow or brown nasal  discharge.  You have pain in your face, especially when you bend forward.  You have a fever.  You have swollen neck glands.  You have pain while swallowing.  You have white areas in the back of your throat. SEEK IMMEDIATE MEDICAL CARE IF:   You have severe or persistent:  Headache.  Ear pain.  Sinus pain.  Chest pain.  You have chronic lung disease and any of the following:  Wheezing.  Prolonged cough.  Coughing up blood.  A change in your usual mucus.  You have a stiff neck.  You have changes in your:  Vision.  Hearing.  Thinking.  Mood. MAKE SURE YOU:   Understand these instructions.  Will watch your condition.  Will get help right away if you are not doing well or get worse.   This information is not intended to replace advice given to you by your health care provider. Make sure you discuss any questions you have with your health care provider.   Document Released: 02/19/2001 Document Revised: 01/10/2015 Document Reviewed: 12/01/2013 Elsevier Interactive Patient Education 2016 Elsevier Inc.  

## 2015-12-26 NOTE — ED Notes (Addendum)
Patient ambulatory to triage with steady gait, without difficulty or distress noted; pt reports x 2wks having congestion, cough and canker sores in mouth

## 2015-12-26 NOTE — ED Provider Notes (Signed)
[facility] Emergency Department Provider Note  ____________________________________________  Time seen: Approximately 9:45 PM  I have reviewed the triage vital signs and the nursing notes.   HISTORY  Chief Complaint Nasal Congestion and Mouth Lesions    HPI Nigel MormonBrandi Jeanne Moret is a 40 y.o. female , NAD, presents to the emergency department with 2 week history of nasal congestion, runny nose, cough, chest congestion. Has noted feeling achy and having diarrhea over the last 2-3 days.Has noted some white pustules in the throat at times. Also notes she has ulcers about her tongue and the interior of the mouth. Has not had any fevers or chills. Has been taking over-the-counter medications without resolution of symptoms. Denies any abdominal pain, nausea, vomiting, chest pain, shortness of breath, wheezing. Has had no ear pain or sore throat.    Past Medical History  Diagnosis Date  . Childhood asthma   . Tension headache   . Elevated blood pressure reading without diagnosis of hypertension   . Migraines   . Panic attacks   . Sleep apnea   . Cervicalgia     ([place])  . Psoriasis     Patient Active Problem List   Diagnosis Date Noted  . Cervical high risk HPV (human papillomavirus) test positive 09/22/2014  . Psoriasis 09/20/2014  . Migraine 07/05/2011  . Tension headache   . Panic attacks     Past Surgical History  Procedure Laterality Date  . Cesarean section  2007; 2009    Current Outpatient Rx  Name  Route  Sig  Dispense  Refill  . acetaminophen (TYLENOL) 500 MG tablet   Oral   Take 500 mg by mouth every 6 (six) hours as needed.         . Adalimumab (HUMIRA PEN-CROHNS STARTER) 40 MG/0.8ML PNKT   Subcutaneous   Inject 40 mg into the skin every 7 (seven) days.         Marland Kitchen. amoxicillin (AMOXIL) 875 MG tablet   Oral   Take 1 tablet (875 mg total) by mouth 2 (two) times daily.   20 tablet   0   . amoxicillin-clavulanate (AUGMENTIN)  875-125 MG per tablet   Oral   Take 1 tablet by mouth every 12 (twelve) hours.   20 tablet   0   . benzonatate (TESSALON) 200 MG capsule   Oral   Take 1 capsule (200 mg total) by mouth 3 (three) times daily as needed for cough.   21 capsule   0   . butalbital-acetaminophen-caffeine (FIORICET) 50-325-40 MG per tablet   Oral   Take 1-2 tablets by mouth every 6 (six) hours as needed for headache.   20 tablet   0   . chlorpheniramine-HYDROcodone (TUSSIONEX PENNKINETIC ER) 10-8 MG/5ML SUER   Oral   Take 5 mLs by mouth 2 (two) times daily.   140 mL   0   . loratadine (CLARITIN) 10 MG tablet   Oral   Take 1 tablet (10 mg total) by mouth daily.   30 tablet   0   . magic mouthwash SOLN   Oral   Take 5 mLs by mouth 4 (four) times daily as needed for mouth pain. Please mix equal amounts of each ingredient totaling 180mL   180 mL   0   . magic mouthwash w/lidocaine SOLN   Oral   Take 5 mLs by mouth 4 (four) times daily.   240 mL   0     Dispense in a  1/1/1/1 ratio. Use lidocaine, diphen ...   . ondansetron (ZOFRAN ODT) 4 MG disintegrating tablet   Oral   Take 1 tablet (4 mg total) by mouth every 8 (eight) hours as needed for nausea or vomiting.   20 tablet   0   . promethazine (PHENERGAN) 25 MG tablet   Oral   Take 1 tablet (25 mg total) by mouth every 6 (six) hours as needed for nausea or vomiting.   15 tablet   0   . promethazine-dextromethorphan (PROMETHAZINE-DM) 6.25-15 MG/5ML syrup   Oral   Take 5 mLs by mouth 4 (four) times daily as needed for cough.   118 mL   0   . sertraline (ZOLOFT) 50 MG tablet   Oral   Take 50 mg by mouth daily.         . SUMAtriptan (IMITREX) 50 MG tablet      TAKE AS DIRECTED   10 tablet   0     plz schedule office visit prior to more refills.     Allergies Review of patient's allergies indicates no known allergies.  Family History  Problem Relation Age of Onset  . Hypertension Father   . Coronary artery disease  Father 68    MI  . Arthritis Mother   . Migraines Mother   . Psoriasis Mother   . Breast cancer Paternal Aunt   . Cancer Paternal Aunt     breast  . Lung cancer Maternal Grandfather   . Cancer Maternal Grandfather 60    lung, smoker  . Hypertension Maternal Uncle   . Diabetes    . Lung cancer Paternal Grandmother   . Cancer Paternal Grandmother 40    lung, smoker  . Mental illness Paternal Grandmother     schizophrenia  . Mental illness Paternal Uncle     bipolar  . Stroke Neg Hx     Social History Social History  Substance Use Topics  . Smoking status: Never Smoker   . Smokeless tobacco: Never Used  . Alcohol Use: No     Comment: Occasional     Review of Systems  Constitutional: No fever/chills, fatigue Eyes: No visual changes. No discharge ENT: Positive nasal congestion, runny nose, mouth sores, exudate in throat. No sore throat, ear pain, sinus pressure. Cardiovascular: No chest pain. Respiratory: Positive chest congestion, cough. No shortness of breath. No wheezing.  Gastrointestinal: Positive diarrhea. No abdominal pain.  No nausea, vomiting.  No diarrhea.   Musculoskeletal: Positive general myalgias Negative for back pain.  Skin: Negative for rash. Neurological: Negative for headaches, focal weakness or numbness. 10-point ROS otherwise negative.  ____________________________________________   PHYSICAL EXAM:  VITAL SIGNS: ED Triage Vitals  Enc Vitals Group     BP 12/26/15 2059 141/77 mmHg     Pulse Rate 12/26/15 2059 95     Resp 12/26/15 2059 18     Temp 12/26/15 2059 98.1 F (36.7 C)     Temp Source 12/26/15 2059 Oral     SpO2 12/26/15 2059 100 %     Weight 12/26/15 2059 190 lb (86.183 kg)     Height 12/26/15 2059  (1.626 m)     Head Cir --      Peak Flow --      Pain Score 12/26/15 2142 8     Pain Loc --      Pain Edu? --      Excl. in GC? --      Constitutional: Alert and  oriented. Well appearing and in no acute distress. Eyes:  Conjunctivae are normal. PERRL. EOMI without pain.  Head: Atraumatic. ENT:      Ears: TMs visualized bilaterally without effusion, bulging, erythema, perforation.      Nose: Moderate congestion with moderate clear rhinnorhea.      Mouth/Throat: 2 small ulcers noted about the tongue. Mucous membranes are moist. Clear postnasal drip. Pharynx and tonsils without exudate, swelling, erythema. Uvula is midline.  Neck: Supple with full range of motion Hematological/Lymphatic/Immunilogical: No cervical lymphadenopathy. Cardiovascular: Normal rate, regular rhythm. Normal S1 and S2.   Respiratory: Normal respiratory effort without tachypnea or retractions. Lungs CTAB with breath sounds noted in all lung fields. Neurologic:  Normal speech and language. No gross focal neurologic deficits are appreciated.  Skin:  Skin is warm, dry and intact. No rash noted. Psychiatric: Mood and affect are normal. Speech and behavior are normal. Patient exhibits appropriate insight and judgement.   ____________________________________________   LABS  None  ____________________________________________  EKG  None ____________________________________________  RADIOLOGY  None ____________________________________________    PROCEDURES  Procedure(s) performed: None      Medications - No data to display   ____________________________________________   INITIAL IMPRESSION / ASSESSMENT AND PLAN / ED COURSE  Patient's diagnosis is consistent with upper respiratory infection with oral aphthous ulcers. Patient will be discharged home with prescriptions for loratadine, magic mouthwash, promethazine DM, amoxicillin to take as directed. Patient is to follow up with [facility] community clinic if symptoms persist past this treatment course. Patient is given ED precautions to return to the ED for any worsening or new symptoms.      ____________________________________________  FINAL CLINICAL IMPRESSION(S)  / ED DIAGNOSES  Final diagnoses:  Upper respiratory infection  Aphthae, oral      NEW MEDICATIONS STARTED DURING THIS VISIT:  Discharge Medication List as of 12/26/2015  9:54 PM    START taking these medications   Details  loratadine (CLARITIN) 10 MG tablet Take 1 tablet (10 mg total) by mouth daily., Starting 12/26/2015, Until Discontinued, Print    magic mouthwash SOLN Take 5 mLs by mouth 4 (four) times daily as needed for mouth pain. Please mix equal amounts of each ingredient totaling , Starting 12/26/2015, Until Discontinued, Print    promethazine-dextromethorphan (PROMETHAZINE-DM) 6.25-15 MG/5ML syrup Take 5 mLs by mouth 4 (four) times daily as needed for cough., Starting 12/26/2015, Until Discontinued, Print             Hope Pigeon, PA-C 12/26/15 2230  Phineas Semen, MD 12/26/15 2248

## 2016-01-05 ENCOUNTER — Encounter: Payer: Self-pay | Admitting: Emergency Medicine

## 2016-01-05 ENCOUNTER — Emergency Department
Admission: EM | Admit: 2016-01-05 | Discharge: 2016-01-05 | Disposition: A | Discharge status: Home / Self Care | Payer: Self-pay | Attending: Emergency Medicine | Admitting: Emergency Medicine

## 2016-01-05 DIAGNOSIS — J0100 Acute maxillary sinusitis, unspecified: Secondary | ICD-10-CM | POA: Insufficient documentation

## 2016-01-05 DIAGNOSIS — J45909 Unspecified asthma, uncomplicated: Secondary | ICD-10-CM | POA: Insufficient documentation

## 2016-01-05 MED ORDER — TRAMADOL HCL 50 MG PO TABS: 50.0000 mg | ORAL_TABLET | Freq: Four times a day (QID) | ORAL | Status: DC | PRN

## 2016-01-05 MED ORDER — AMOXICILLIN-POT CLAVULANATE 875-125 MG PO TABS: 1.0000 | ORAL_TABLET | Freq: Two times a day (BID) | ORAL | Status: AC

## 2016-01-05 NOTE — ED Notes (Signed)
Pt. States intermittent HA with congestion for the past two weeks.  Pt. States generalized pain in shoulders.  Pt. States hx of migrains, but pt. States this does not feel like one.

## 2016-01-05 NOTE — Discharge Instructions (Signed)

## 2016-01-05 NOTE — ED Provider Notes (Signed)
[facility] Emergency Department Provider Note  ____________________________________________  Time seen: On arrival  I have reviewed the triage vital signs and the nursing notes.   HISTORY  Chief Complaint Headache    HPI Makayla Duke is a 40 y.o. female who presents with complaints of pressure in her sinuses. Patient reports recent upper respiratory infection and many sick kids in her household. She notes a pressure that seems severe across her sinuses. She reports it makes her teeth hurt. No headache. Denies fevers. No neck pain.    Past Medical History  Diagnosis Date  . Childhood asthma   . Tension headache   . Elevated blood pressure reading without diagnosis of hypertension   . Migraines   . Panic attacks   . Sleep apnea   . Cervicalgia     ([place])  . Psoriasis     Patient Active Problem List   Diagnosis Date Noted  . Cervical high risk HPV (human papillomavirus) test positive 09/22/2014  . Psoriasis 09/20/2014  . Migraine 07/05/2011  . Tension headache   . Panic attacks     Past Surgical History  Procedure Laterality Date  . Cesarean section  2007; 2009    Current Outpatient Rx  Name  Route  Sig  Dispense  Refill  . acetaminophen (TYLENOL) 500 MG tablet   Oral   Take 500 mg by mouth every 6 (six) hours as needed.         . Adalimumab (HUMIRA PEN-CROHNS STARTER) 40 MG/0.8ML PNKT   Subcutaneous   Inject 40 mg into the skin every 7 (seven) days.         Marland Kitchen. amoxicillin (AMOXIL) 875 MG tablet   Oral   Take 1 tablet (875 mg total) by mouth 2 (two) times daily.   20 tablet   0   . amoxicillin-clavulanate (AUGMENTIN) 875-125 MG tablet   Oral   Take 1 tablet by mouth 2 (two) times daily.   14 tablet   0   . benzonatate (TESSALON) 200 MG capsule   Oral   Take 1 capsule (200 mg total) by mouth 3 (three) times daily as needed for cough.   21 capsule   0   . butalbital-acetaminophen-caffeine (FIORICET) 50-325-40 MG  per tablet   Oral   Take 1-2 tablets by mouth every 6 (six) hours as needed for headache.   20 tablet   0   . chlorpheniramine-HYDROcodone (TUSSIONEX PENNKINETIC ER) 10-8 MG/5ML SUER   Oral   Take 5 mLs by mouth 2 (two) times daily.   140 mL   0   . loratadine (CLARITIN) 10 MG tablet   Oral   Take 1 tablet (10 mg total) by mouth daily.   30 tablet   0   . magic mouthwash SOLN   Oral   Take 5 mLs by mouth 4 (four) times daily as needed for mouth pain. Please mix equal amounts of each ingredient totaling 180mL   180 mL   0   . magic mouthwash w/lidocaine SOLN   Oral   Take 5 mLs by mouth 4 (four) times daily.   240 mL   0     Dispense in a 1/1/1/1 ratio. Use lidocaine, diphen ...   . ondansetron (ZOFRAN ODT) 4 MG disintegrating tablet   Oral   Take 1 tablet (4 mg total) by mouth every 8 (eight) hours as needed for nausea or vomiting.   20 tablet   0   . promethazine (  PHENERGAN) 25 MG tablet   Oral   Take 1 tablet (25 mg total) by mouth every 6 (six) hours as needed for nausea or vomiting.   15 tablet   0   . promethazine-dextromethorphan (PROMETHAZINE-DM) 6.25-15 MG/5ML syrup   Oral   Take 5 mLs by mouth 4 (four) times daily as needed for cough.   118 mL   0   . sertraline (ZOLOFT) 50 MG tablet   Oral   Take 50 mg by mouth daily.         . SUMAtriptan (IMITREX) 50 MG tablet      TAKE AS DIRECTED   10 tablet   0     plz schedule office visit prior to more refills.   . traMADol (ULTRAM) 50 MG tablet   Oral   Take 1 tablet (50 mg total) by mouth every 6 (six) hours as needed.   12 tablet   0     Allergies Review of patient's allergies indicates no known allergies.  Family History  Problem Relation Age of Onset  . Hypertension Father   . Coronary artery disease Father 86    MI  . Arthritis Mother   . Migraines Mother   . Psoriasis Mother   . Breast cancer Paternal Aunt   . Cancer Paternal Aunt     breast  . Lung cancer Maternal  Grandfather   . Cancer Maternal Grandfather 60    lung, smoker  . Hypertension Maternal Uncle   . Diabetes    . Lung cancer Paternal Grandmother   . Cancer Paternal Grandmother 40    lung, smoker  . Mental illness Paternal Grandmother     schizophrenia  . Mental illness Paternal Uncle     bipolar  . Stroke Neg Hx     Social History Social History  Substance Use Topics  . Smoking status: Never Smoker   . Smokeless tobacco: Never Used  . Alcohol Use: No     Comment: Occasional    Review of Systems  Constitutional: Negative for fever. Eyes: Negative for visual changes. ENT: Positive for sinus pressure    Musculoskeletal: Negative for neck pain Skin: Negative for rash. Neurological: Negative for headaches or focal weakness   ____________________________________________   PHYSICAL EXAM:  VITAL SIGNS: ED Triage Vitals  Enc Vitals Group     BP 01/05/16 1832 163/103 mmHg     Pulse Rate 01/05/16 1832 113     Resp 01/05/16 1832 16     Temp 01/05/16 1832 98.3 F (36.8 C)     Temp Source 01/05/16 1832 Oral     SpO2 01/05/16 1832 98 %     Weight 01/05/16 1832 190 lb (86.183 kg)     Height 01/05/16 1832  (1.626 m)     Head Cir --      Peak Flow --      Pain Score 01/05/16 1832 10     Pain Loc --      Pain Edu? --      Excl. in GC? --      Constitutional: Alert and oriented. Well appearing and in no distress. Pleasant and interactive Eyes: Conjunctivae are normal.  ENT   Head: Normocephalic and atraumatic. Tenderness over the maxillary and frontal sinuses.   Mouth/Throat: Mucous membranes are moist. Cardiovascular: Normal rate, regular rhythm. Heart rate 92 on my exam Respiratory: Normal respiratory effort without tachypnea nor retractions.   Musculoskeletal: Nontender with normal range of motion in all extremities.  Neurologic:  Normal speech and language. No gross focal neurologic deficits are appreciated. Skin:  Skin is warm, dry and intact. No  rash noted. Psychiatric: Mood and affect are normal. Patient exhibits appropriate insight and judgment.  ____________________________________________    LABS (pertinent positives/negatives)  Labs Reviewed - No data to display  ____________________________________________     ____________________________________________    RADIOLOGY I have personally reviewed any xrays that were ordered on this patient: None  ____________________________________________   PROCEDURES  Procedure(s) performed: none   ____________________________________________   INITIAL IMPRESSION / ASSESSMENT AND PLAN / ED COURSE  Pertinent labs & imaging results that were available during my care of the patient were reviewed by me and considered in my medical decision making (see chart for details).  Patient presents with signs and symptoms of sinusitis. She has tenderness over both sinuses and had a recent URI. I'll treat with Augmentin. She knows to return if worsening symptoms.  ____________________________________________   FINAL CLINICAL IMPRESSION(S) / ED DIAGNOSES  Final diagnoses:  Acute maxillary sinusitis, recurrence not specified     Jene Every, MD 01/05/16 2024

## 2016-02-08 ENCOUNTER — Encounter: Payer: Self-pay | Admitting: Emergency Medicine

## 2016-02-08 ENCOUNTER — Emergency Department
Admission: EM | Admit: 2016-02-08 | Discharge: 2016-02-08 | Disposition: A | Discharge status: Home / Self Care | Payer: Self-pay | Attending: Emergency Medicine | Admitting: Emergency Medicine

## 2016-02-08 ENCOUNTER — Emergency Department: Payer: Self-pay

## 2016-02-08 DIAGNOSIS — J209 Acute bronchitis, unspecified: Secondary | ICD-10-CM | POA: Insufficient documentation

## 2016-02-08 DIAGNOSIS — J45909 Unspecified asthma, uncomplicated: Secondary | ICD-10-CM | POA: Insufficient documentation

## 2016-02-08 MED ORDER — PREDNISONE 10 MG PO TABS: 50.0000 mg | ORAL_TABLET | Freq: Every day | ORAL | Status: DC

## 2016-02-08 MED ORDER — AZITHROMYCIN 250 MG PO TABS: ORAL_TABLET | ORAL | Status: DC

## 2016-02-08 MED ORDER — ALBUTEROL SULFATE HFA 108 (90 BASE) MCG/ACT IN AERS: 2.0000 | INHALATION_SPRAY | Freq: Four times a day (QID) | RESPIRATORY_TRACT | Status: DC | PRN

## 2016-02-08 MED ORDER — GUAIFENESIN-CODEINE 100-10 MG/5ML PO SYRP: 5.0000 mL | ORAL_SOLUTION | Freq: Three times a day (TID) | ORAL | Status: DC | PRN

## 2016-02-08 NOTE — ED Notes (Deleted)
Pt with fever today, as high 101.4; reports taking ibu at 0730. Pt reports headache and bilateral ear pain; reports dizziness.

## 2016-02-08 NOTE — ED Provider Notes (Signed)
[facility] Emergency Department Provider Note  ____________________________________________  Time seen: Approximately 4:47 PM  I have reviewed the triage vital signs and the nursing notes.   HISTORY  Chief Complaint Cough   HPI Makayla Duke is a 40 y.o. female who presents to the emergency department for evaluation of URI symptoms x 2 weeks. She has been taking tylenol and ibuprofen for pain that comes from coughing. She states that she has low energy and difficulty sleeping. Fevers at home up to around 102 today.    Past Medical History  Diagnosis Date  . Childhood asthma   . Tension headache   . Elevated blood pressure reading without diagnosis of hypertension   . Migraines   . Panic attacks   . Sleep apnea   . Cervicalgia     ([place])  . Psoriasis     Patient Active Problem List   Diagnosis Date Noted  . Cervical high risk HPV (human papillomavirus) test positive 09/22/2014  . Psoriasis 09/20/2014  . Migraine 07/05/2011  . Tension headache   . Panic attacks     Past Surgical History  Procedure Laterality Date  . Cesarean section  2007; 2009    Current Outpatient Rx  Name  Route  Sig  Dispense  Refill  . acetaminophen (TYLENOL) 500 MG tablet   Oral   Take 500 mg by mouth every 6 (six) hours as needed.         . Adalimumab (HUMIRA PEN-CROHNS STARTER) 40 MG/0.8ML PNKT   Subcutaneous   Inject 40 mg into the skin every 7 (seven) days.         Marland Kitchen albuterol (PROVENTIL HFA;VENTOLIN HFA) 108 (90 Base) MCG/ACT inhaler   Inhalation   Inhale 2 puffs into the lungs every 6 (six) hours as needed for wheezing or shortness of breath.   1 Inhaler   2   . amoxicillin (AMOXIL) 875 MG tablet   Oral   Take 1 tablet (875 mg total) by mouth 2 (two) times daily.   20 tablet   0   . azithromycin (ZITHROMAX) 250 MG tablet      2 tablets today, then 1 tablet for the next 4 days.   6 each   0   . benzonatate (TESSALON) 200 MG  capsule   Oral   Take 1 capsule (200 mg total) by mouth 3 (three) times daily as needed for cough.   21 capsule   0   . butalbital-acetaminophen-caffeine (FIORICET) 50-325-40 MG per tablet   Oral   Take 1-2 tablets by mouth every 6 (six) hours as needed for headache.   20 tablet   0   . chlorpheniramine-HYDROcodone (TUSSIONEX PENNKINETIC ER) 10-8 MG/5ML SUER   Oral   Take 5 mLs by mouth 2 (two) times daily.   140 mL   0   . guaiFENesin-codeine (ROBITUSSIN AC) 100-10 MG/5ML syrup   Oral   Take 5 mLs by mouth 3 (three) times daily as needed for cough.   120 mL   0   . loratadine (CLARITIN) 10 MG tablet   Oral   Take 1 tablet (10 mg total) by mouth daily.   30 tablet   0   . magic mouthwash SOLN   Oral   Take 5 mLs by mouth 4 (four) times daily as needed for mouth pain. Please mix equal amounts of each ingredient totaling   180 mL   0   . magic mouthwash w/lidocaine SOLN  Oral   Take 5 mLs by mouth 4 (four) times daily.   240 mL   0     Dispense in a 1/1/1/1 ratio. Use lidocaine, diphen ...   . ondansetron (ZOFRAN ODT) 4 MG disintegrating tablet   Oral   Take 1 tablet (4 mg total) by mouth every 8 (eight) hours as needed for nausea or vomiting.   20 tablet   0   . predniSONE (DELTASONE) 10 MG tablet   Oral   Take 5 tablets (50 mg total) by mouth daily.   25 tablet   0   . promethazine (PHENERGAN) 25 MG tablet   Oral   Take 1 tablet (25 mg total) by mouth every 6 (six) hours as needed for nausea or vomiting.   15 tablet   0   . promethazine-dextromethorphan (PROMETHAZINE-DM) 6.25-15 MG/5ML syrup   Oral   Take 5 mLs by mouth 4 (four) times daily as needed for cough.   118 mL   0   . sertraline (ZOLOFT) 50 MG tablet   Oral   Take 50 mg by mouth daily.         . SUMAtriptan (IMITREX) 50 MG tablet      TAKE AS DIRECTED   10 tablet   0     plz schedule office visit prior to more refills.   . traMADol (ULTRAM) 50 MG tablet   Oral   Take  1 tablet (50 mg total) by mouth every 6 (six) hours as needed.   12 tablet   0     Allergies Review of patient's allergies indicates no known allergies.  Family History  Problem Relation Age of Onset  . Hypertension Father   . Coronary artery disease Father 80    MI  . Arthritis Mother   . Migraines Mother   . Psoriasis Mother   . Breast cancer Paternal Aunt   . Cancer Paternal Aunt     breast  . Lung cancer Maternal Grandfather   . Cancer Maternal Grandfather 60    lung, smoker  . Hypertension Maternal Uncle   . Diabetes    . Lung cancer Paternal Grandmother   . Cancer Paternal Grandmother 40    lung, smoker  . Mental illness Paternal Grandmother     schizophrenia  . Mental illness Paternal Uncle     bipolar  . Stroke Neg Hx     Social History Social History  Substance Use Topics  . Smoking status: Never Smoker   . Smokeless tobacco: Never Used  . Alcohol Use: No     Comment: Occasional    Review of Systems Constitutional: Positive for fever/chills ENT: Negative for sore throat. Cardiovascular: Denies chest pain. Respiratory: Negative for shortness of breath. Positive for cough. Gastrointestinal: Negative for nausea,  no vomiting.  No diarrhea.  Musculoskeletal: Negative for body aches Skin: Negative for rash. Neurological: Negative for headaches ____________________________________________   PHYSICAL EXAM:  VITAL SIGNS: ED Triage Vitals  Enc Vitals Group     BP 02/08/16 1632 135/95 mmHg     Pulse Rate 02/08/16 1631 88     Resp 02/08/16 1631 18     Temp 02/08/16 1631 98.5 F (36.9 C)     Temp Source 02/08/16 1631 Oral     SpO2 02/08/16 1631 99 %     Weight 02/08/16 1631 195 lb (88.451 kg)     Height 02/08/16 1631  (1.626 m)     Head Cir --  Peak Flow --      Pain Score 02/08/16 1632 8     Pain Loc --      Pain Edu? --      Excl. in GC? --     Constitutional: Alert and oriented. Acutely ill appearing and in no acute  distress. Eyes: Conjunctivae are normal. EOMI. Ears: Normal TM Nose: No congestion; no rhinnorhea. Mouth/Throat: Mucous membranes are moist.  Oropharynx normal. Tonsils appear normal. Neck: No stridor.  Lymphatic: No cervical lymphadenopathy. Cardiovascular: Normal rate, regular rhythm. Grossly normal heart sounds.  Good peripheral circulation. Respiratory: Normal respiratory effort.  No retractions. Rhonchi in right mid and lower lung. Gastrointestinal: Soft and nontender.  Musculoskeletal: FROM x 4 extremities.  Neurologic:  Normal speech and language.  Skin:  Skin is warm, dry and intact. No rash noted. Psychiatric: Mood and affect are normal. Speech and behavior are normal.  ____________________________________________   LABS (all labs ordered are listed, but only abnormal results are displayed)  Labs Reviewed - No data to display ____________________________________________  EKG   ____________________________________________  RADIOLOGY  Chest x-ray negative for acute cardiopulmonary abnormality. Per radiology. ____________________________________________   PROCEDURES  Procedure(s) performed: None  Critical Care performed: No  ____________________________________________   INITIAL IMPRESSION / ASSESSMENT AND PLAN / ED COURSE  Pertinent labs & imaging results that were available during my care of the patient were reviewed by me and considered in my medical decision making (see chart for details).   Based on clinical exam, patient will receive prescriptions for azithromycin, prednisone, albuterol, and Robitussin AC. She was encouraged to call [facility] to schedule a follow-up appointment. She was encouraged to return to the emergency department for symptoms that change or worsen if she is unable to schedule an appointment. ____________________________________________   FINAL CLINICAL IMPRESSION(S) / ED DIAGNOSES  Final diagnoses:   Acute bronchitis, unspecified organism       Chinita PesterCari B Nadirah Socorro, FNP 02/08/16 2030  Sharman CheekPhillip Stafford, MD 02/09/16 2329

## 2016-02-08 NOTE — ED Notes (Signed)
PT states she has had a cold for 2 weeks, now with cough and chest congestion with fever, tmax of 102.2.  Pt took excedrin migraine at 1:30 today.  Pt states her chest hurts with deep breaths.

## 2016-02-08 NOTE — ED Notes (Signed)
AAOx3.  Skin warm and dry.  NAD 

## 2016-02-08 NOTE — Discharge Instructions (Signed)

## 2016-02-08 NOTE — ED Notes (Signed)
Pt in via triage with complaints of a cold x 2 weeks; pt reports cough and chest pain x 1 week.  Lung sounds clear, no signs of immediate distress at this time.

## 2016-05-16 ENCOUNTER — Emergency Department
Admission: EM | Admit: 2016-05-16 | Discharge: 2016-05-16 | Disposition: A | Discharge status: Home / Self Care | Payer: Self-pay | Attending: Student | Admitting: Student

## 2016-05-16 ENCOUNTER — Encounter: Payer: Self-pay | Admitting: Emergency Medicine

## 2016-05-16 DIAGNOSIS — G43009 Migraine without aura, not intractable, without status migrainosus: Secondary | ICD-10-CM

## 2016-05-16 DIAGNOSIS — G43909 Migraine, unspecified, not intractable, without status migrainosus: Secondary | ICD-10-CM | POA: Insufficient documentation

## 2016-05-16 DIAGNOSIS — J45909 Unspecified asthma, uncomplicated: Secondary | ICD-10-CM | POA: Insufficient documentation

## 2016-05-16 DIAGNOSIS — Z79899 Other long term (current) drug therapy: Secondary | ICD-10-CM | POA: Insufficient documentation

## 2016-05-16 MED ORDER — ONDANSETRON 4 MG PO TBDP
4.0000 mg | ORAL_TABLET | Freq: Once | ORAL | Status: AC
  Administered 2016-05-16: 4 mg via ORAL
  Filled 2016-05-16: qty 1

## 2016-05-16 MED ORDER — SUMATRIPTAN SUCCINATE 6 MG/0.5ML ~~LOC~~ SOLN
6.0000 mg | Freq: Once | SUBCUTANEOUS | Status: AC
  Administered 2016-05-16: 6 mg via SUBCUTANEOUS
  Filled 2016-05-16: qty 0.5

## 2016-05-16 MED ORDER — SUMATRIPTAN SUCCINATE 100 MG PO TABS: 100.0000 mg | ORAL_TABLET | Freq: Once | ORAL | 0 refills | Status: DC | PRN

## 2016-05-16 NOTE — ED Provider Notes (Signed)
[facility] Emergency Department Provider Note  ____________________________________________  Time seen: Approximately 5:16 PM  I have reviewed the triage vital signs and the nursing notes.   HISTORY  Chief Complaint Migraine    HPI Makayla Duke is a 40 y.o. female, NAD, presents to the emergency department with migraine headache since about 8PM last night. Headache did not have a thunderclap onset and patient states this is not the worst headache of her life. Migraine is typical of her normal pattern with photophobia, phonophobia, and sensitivity to odors. Pain is over left orbital region and is constant. She also has had some nausea with one episode of vomiting. Denies any fevers, visual change, chest pain, shortness of breath, abdominal pain, weakness, numbness or tingling, difficulty walking. Patient normally takes Sumatriptan for migraine relief, but ran out of her prescription and was unable to get more. She has taken 2 Excedrin migraine tablets and tried to rest with no relief. Stated that she has taken sumatriptan this late into migraine with relief of headache.    Past Medical History:  Diagnosis Date  . Cervicalgia    ([place])  . Childhood asthma   . Elevated blood pressure reading without diagnosis of hypertension   . Migraines   . Panic attacks   . Psoriasis   . Sleep apnea   . Tension headache     Patient Active Problem List   Diagnosis Date Noted  . Cervical high risk HPV (human papillomavirus) test positive 09/22/2014  . Psoriasis 09/20/2014  . Migraine 07/05/2011  . Tension headache   . Panic attacks     Past Surgical History:  Procedure Laterality Date  . CESAREAN SECTION  2007; 2009    Prior to Admission medications   Medication Sig Start Date End Date Taking? Authorizing Provider  acetaminophen (TYLENOL) 500 MG tablet Take 500 mg by mouth every 6 (six) hours as needed.    Historical Provider, MD  Adalimumab (HUMIRA  PEN-CROHNS STARTER) 40 MG/0.8ML PNKT Inject 40 mg into the skin every 7 (seven) days.    Historical Provider, MD  albuterol (PROVENTIL HFA;VENTOLIN HFA) 108 (90 Base) MCG/ACT inhaler Inhale 2 puffs into the lungs every 6 (six) hours as needed for wheezing or shortness of breath. 02/08/16   Chinita Pesterari B Triplett, FNP  amoxicillin (AMOXIL) 875 MG tablet Take 1 tablet (875 mg total) by mouth 2 (two) times daily. 12/26/15   Jami L Hagler, PA-C  azithromycin (ZITHROMAX) 250 MG tablet 2 tablets today, then 1 tablet for the next 4 days. 02/08/16   Chinita Pesterari B Triplett, FNP  benzonatate (TESSALON) 200 MG capsule Take 1 capsule (200 mg total) by mouth 3 (three) times daily as needed for cough. 09/25/15   Delorise RoyalsJonathan D Cuthriell, PA-C  chlorpheniramine-HYDROcodone (TUSSIONEX PENNKINETIC ER) 10-8 MG/5ML SUER Take 5 mLs by mouth 2 (two) times daily. 04/18/15   Lutricia FeilWilliam P Roemer, PA-C  guaiFENesin-codeine (ROBITUSSIN AC) 100-10 MG/5ML syrup Take 5 mLs by mouth 3 (three) times daily as needed for cough. 02/08/16   Chinita Pesterari B Triplett, FNP  loratadine (CLARITIN) 10 MG tablet Take 1 tablet (10 mg total) by mouth daily. 12/26/15   Jami L Hagler, PA-C  magic mouthwash SOLN Take 5 mLs by mouth 4 (four) times daily as needed for mouth pain. Please mix equal amounts of each ingredient totaling 180mL 12/26/15   Jami L Hagler, PA-C  magic mouthwash w/lidocaine SOLN Take 5 mLs by mouth 4 (four) times daily. 09/25/15   Delorise RoyalsJonathan D Cuthriell, PA-C  ondansetron (ZOFRAN ODT) 4 MG disintegrating tablet Take 1 tablet (4 mg total) by mouth every 8 (eight) hours as needed for nausea or vomiting. 11/21/15   Rebecka Apley, MD  predniSONE (DELTASONE) 10 MG tablet Take 5 tablets (50 mg total) by mouth daily. 02/08/16   Chinita Pester, FNP  promethazine (PHENERGAN) 25 MG tablet Take 1 tablet (25 mg total) by mouth every 6 (six) hours as needed for nausea or vomiting. 03/26/15   Evangeline Dakin, PA-C  promethazine-dextromethorphan (PROMETHAZINE-DM) 6.25-15 MG/5ML syrup  Take 5 mLs by mouth 4 (four) times daily as needed for cough. 12/26/15   Jami L Hagler, PA-C  sertraline (ZOLOFT) 50 MG tablet Take 50 mg by mouth daily.    Historical Provider, MD  SUMAtriptan (IMITREX) 100 MG tablet Take 1 tablet (100 mg total) by mouth once as needed for migraine. May repeat in 2 hours if headache persists or recurs. 05/16/16 05/17/17  Jami L Hagler, PA-C  traMADol (ULTRAM) 50 MG tablet Take 1 tablet (50 mg total) by mouth every 6 (six) hours as needed. 01/05/16 01/04/17  Jene Every, MD    Allergies Review of patient's allergies indicates no known allergies.  Family History  Problem Relation Age of Onset  . Hypertension Father   . Coronary artery disease Father 65    MI  . Arthritis Mother   . Migraines Mother   . Psoriasis Mother   . Breast cancer Paternal Aunt   . Cancer Paternal Aunt     breast  . Lung cancer Maternal Grandfather   . Cancer Maternal Grandfather 60    lung, smoker  . Hypertension Maternal Uncle   . Diabetes    . Lung cancer Paternal Grandmother   . Cancer Paternal Grandmother 40    lung, smoker  . Mental illness Paternal Grandmother     schizophrenia  . Mental illness Paternal Uncle     bipolar  . Stroke Neg Hx     Social History Social History  Substance Use Topics  . Smoking status: Never Smoker  . Smokeless tobacco: Never Used  . Alcohol use No     Comment: Occasional     Review of Systems  Constitutional: No fever/chills Eyes: No visual changes.  Cardiovascular: No chest pain. Respiratory: No shortness of breath. No wheezing.  Gastrointestinal: Positive Nausea with one episode vomiting. No abdominal pain Musculoskeletal: Negative for Neck pain, general myalgias.  Skin: Negative for rash, kin sores, redness, swelling. Neurological: Positive for migraine Headache with photophobia, phonophobia and sensitivity to smell. Negative for focal weakness or numbness. No tingling. 10-point ROS otherwise  negative.  ____________________________________________   PHYSICAL EXAM:  VITAL SIGNS: ED Triage Vitals  Enc Vitals Group     BP 05/16/16 1707 133/90     Pulse Rate 05/16/16 1707 78     Resp 05/16/16 1707 18     Temp 05/16/16 1707 97.4 F (36.3 C)     Temp Source 05/16/16 1707 Oral     SpO2 05/16/16 1707 97 %     Weight 05/16/16 1708 200 lb (90.7 kg)     Height 05/16/16 1708 5\' 4"  (1.626 m)     Head Circumference --      Peak Flow --      Pain Score 05/16/16 1709 10     Pain Loc --      Pain Edu? --      Excl. in GC? --      Constitutional: Alert and oriented. Well  appearing and in no acute distress. Eyes: Conjunctivae are normal. PERRLA.   Head: Atraumatic.  Neck: Supple with full range of motion. Hematological/Lymphatic/Immunilogical: No cervical lymphadenopathy. Cardiovascular: Normal rate, regular rhythm. Normal S1 and S2.  Good peripheral circulation. Respiratory: Normal respiratory effort without tachypnea or retractions. Lungs CTAB with breath sounds noted in all lung fields Musculoskeletal: Full range of motion of bilateral upper and lower extremities without pain or difficulty. Neurologic:  Normal speech and language. No gross focal neurologic deficits are appreciated. Gait and posture are normal. Cranial nerves III through XII grossly intact. Skin:  Skin is warm, dry and intact. No rash noted. Psychiatric: Mood and affect are normal. Speech and behavior are normal. Patient exhibits appropriate insight and judgement.   ____________________________________________   LABS  None ____________________________________________  EKG  None ____________________________________________  RADIOLOGY  None ____________________________________________    PROCEDURES  Procedure(s) performed: None   Procedures   Medications  SUMAtriptan (IMITREX) injection 6 mg (6 mg Subcutaneous Given 05/16/16 1818)  ondansetron (ZOFRAN-ODT) disintegrating tablet 4 mg (4 mg  Oral Given 05/16/16 1818)     ____________________________________________   INITIAL IMPRESSION / ASSESSMENT AND PLAN / ED COURSE  Pertinent labs & imaging results that were available during my care of the patient were reviewed by me and considered in my medical decision making (see chart for details).  Clinical Course    Patient's diagnosis is consistent with Migraine headache without aura and without status migrainosus but is not intractable. Patient tolerated injection of Imitrex well without any side effects. Notes headache has improved. Patient will be discharged home with prescriptions for Imitrex to take as previously directed. Patient is follow-up with her primary care provider or [facility] to gain further refills on her Imitrex.  Patient is given ED precautions to return to the ED for any worsening or new symptoms.    ____________________________________________  FINAL CLINICAL IMPRESSION(S) / ED DIAGNOSES  Final diagnoses:  Migraine without aura and without status migrainosus, not intractable      NEW MEDICATIONS STARTED DURING THIS VISIT:  New Prescriptions   SUMATRIPTAN (IMITREX) 100 MG TABLET    Take 1 tablet (100 mg total) by mouth once as needed for migraine. May repeat in 2 hours if headache persists or recurs.        Hope Pigeon, PA-C 05/16/16 1852    Gayla Doss, MD 05/16/16 2147

## 2016-05-16 NOTE — ED Triage Notes (Signed)
Pt comes to the ED via EMS from home c/o migraine since yesterday.  Patient states that she normally takes imitrex but has run out of her Rx.  Patient in NAD at this time and denies chest pain, dizziness, N/V.

## 2016-05-29 ENCOUNTER — Emergency Department
Admission: EM | Admit: 2016-05-29 | Discharge: 2016-05-29 | Disposition: A | Discharge status: Home / Self Care | Payer: Self-pay | Attending: Emergency Medicine | Admitting: Emergency Medicine

## 2016-05-29 DIAGNOSIS — G43109 Migraine with aura, not intractable, without status migrainosus: Secondary | ICD-10-CM | POA: Insufficient documentation

## 2016-05-29 DIAGNOSIS — J45909 Unspecified asthma, uncomplicated: Secondary | ICD-10-CM | POA: Insufficient documentation

## 2016-05-29 DIAGNOSIS — Z79899 Other long term (current) drug therapy: Secondary | ICD-10-CM | POA: Insufficient documentation

## 2016-05-29 MED ORDER — SUMATRIPTAN SUCCINATE 6 MG/0.5ML ~~LOC~~ SOLN
SUBCUTANEOUS | Status: AC
  Administered 2016-05-29: 6 mg via SUBCUTANEOUS
  Filled 2016-05-29: qty 0.5

## 2016-05-29 MED ORDER — SUMATRIPTAN SUCCINATE 100 MG PO TABS: 100.0000 mg | ORAL_TABLET | Freq: Once | ORAL | 0 refills | Status: DC | PRN

## 2016-05-29 MED ORDER — SUMATRIPTAN SUCCINATE 6 MG/0.5ML ~~LOC~~ SOLN
6.0000 mg | Freq: Once | SUBCUTANEOUS | Status: AC
Start: 2016-05-29 — End: 2016-05-29
  Administered 2016-05-29: 6 mg via SUBCUTANEOUS
  Filled 2016-05-29: qty 0.5

## 2016-05-29 NOTE — ED Provider Notes (Signed)
[facility] Emergency Department Provider Note  ____________________________________________  Time seen: Approximately 1:59 PM  I have reviewed the triage vital signs and the nursing notes.   HISTORY  Chief Complaint Migraine    HPI Makayla Duke is a 40 y.o. female who complains of a right sided headache behind the eye that started this morning about 4 AM. She has a history of migraines and this is completely typical of her migraines. Usually after a few hours of the headache, it progresses to nausea and vomiting and becomes more severe. She does report some photophobia. No recent trauma or illness. No fevers or neck stiffness. She actually dreaming that she was going to the store to pick up Imitrex when her symptoms started. She is recently run out of her Imitrex and been unable to follow up with her doctor due to a lapse in health insurance. No other acute complaints. She reports that usually at this stage Imitrex works quite well for controlling her symptoms and preventing progression.  Headache is pulsating in moderate intensity. No excessive lacrimation or sweating or flushing. No numbness tingling weakness or vision changes. She has noticed a change in her olfactory perception as well, typical of her aura..   Past Medical History:  Diagnosis Date  . Cervicalgia    ([place])  . Childhood asthma   . Elevated blood pressure reading without diagnosis of hypertension   . Migraines   . Panic attacks   . Psoriasis   . Sleep apnea   . Tension headache      Patient Active Problem List   Diagnosis Date Noted  . Cervical high risk HPV (human papillomavirus) test positive 09/22/2014  . Psoriasis 09/20/2014  . Migraine 07/05/2011  . Tension headache   . Panic attacks      Past Surgical History:  Procedure Laterality Date  . CESAREAN SECTION  2007; 2009     Prior to Admission medications   Medication Sig Start Date End Date Taking? Authorizing  Provider  acetaminophen (TYLENOL) 500 MG tablet Take 500 mg by mouth every 6 (six) hours as needed.    Historical Provider, MD  Adalimumab (HUMIRA PEN-CROHNS STARTER) 40 MG/0.8ML PNKT Inject 40 mg into the skin every 7 (seven) days.    Historical Provider, MD  albuterol (PROVENTIL HFA;VENTOLIN HFA) 108 (90 Base) MCG/ACT inhaler Inhale 2 puffs into the lungs every 6 (six) hours as needed for wheezing or shortness of breath. 02/08/16   Chinita Pester, FNP  amoxicillin (AMOXIL) 875 MG tablet Take 1 tablet (875 mg total) by mouth 2 (two) times daily. 12/26/15   Jami L Hagler, PA-C  azithromycin (ZITHROMAX) 250 MG tablet 2 tablets today, then 1 tablet for the next 4 days. 02/08/16   Chinita Pester, FNP  benzonatate (TESSALON) 200 MG capsule Take 1 capsule (200 mg total) by mouth 3 (three) times daily as needed for cough. 09/25/15   Delorise Royals Cuthriell, PA-C  chlorpheniramine-HYDROcodone (TUSSIONEX PENNKINETIC ER) 10-8 MG/5ML SUER Take 5 mLs by mouth 2 (two) times daily. 04/18/15   Lutricia Feil, PA-C  guaiFENesin-codeine (ROBITUSSIN AC) 100-10 MG/5ML syrup Take 5 mLs by mouth 3 (three) times daily as needed for cough. 02/08/16   Chinita Pester, FNP  loratadine (CLARITIN) 10 MG tablet Take 1 tablet (10 mg total) by mouth daily. 12/26/15   Jami L Hagler, PA-C  magic mouthwash SOLN Take 5 mLs by mouth 4 (four) times daily as needed for mouth pain. Please mix equal amounts  of each ingredient totaling 180mL 12/26/15   Jami L Hagler, PA-C  magic mouthwash w/lidocaine SOLN Take 5 mLs by mouth 4 (four) times daily. 09/25/15   Delorise RoyalsJonathan D Cuthriell, PA-C  ondansetron (ZOFRAN ODT) 4 MG disintegrating tablet Take 1 tablet (4 mg total) by mouth every 8 (eight) hours as needed for nausea or vomiting. 11/21/15   Rebecka ApleyAllison P Webster, MD  predniSONE (DELTASONE) 10 MG tablet Take 5 tablets (50 mg total) by mouth daily. 02/08/16   Chinita Pesterari B Triplett, FNP  promethazine (PHENERGAN) 25 MG tablet Take 1 tablet (25 mg total) by mouth every 6  (six) hours as needed for nausea or vomiting. 03/26/15   Evangeline Dakinharles M Beers, PA-C  promethazine-dextromethorphan (PROMETHAZINE-DM) 6.25-15 MG/5ML syrup Take 5 mLs by mouth 4 (four) times daily as needed for cough. 12/26/15   Jami L Hagler, PA-C  sertraline (ZOLOFT) 50 MG tablet Take 50 mg by mouth daily.    Historical Provider, MD  SUMAtriptan (IMITREX) 100 MG tablet Take 1 tablet (100 mg total) by mouth once as needed for migraine. May repeat in 2 hours if headache persists or recurs. 05/29/16 05/30/17  Sharman CheekPhillip Koki Buxton, MD  traMADol (ULTRAM) 50 MG tablet Take 1 tablet (50 mg total) by mouth every 6 (six) hours as needed. 01/05/16 01/04/17  Jene Everyobert Kinner, MD     Allergies Review of patient's allergies indicates no known allergies.   Family History  Problem Relation Age of Onset  . Hypertension Father   . Coronary artery disease Father 2051    MI  . Arthritis Mother   . Migraines Mother   . Psoriasis Mother   . Breast cancer Paternal Aunt   . Cancer Paternal Aunt     breast  . Lung cancer Maternal Grandfather   . Cancer Maternal Grandfather 60    lung, smoker  . Hypertension Maternal Uncle   . Diabetes    . Lung cancer Paternal Grandmother   . Cancer Paternal Grandmother 40    lung, smoker  . Mental illness Paternal Grandmother     schizophrenia  . Mental illness Paternal Uncle     bipolar  . Stroke Neg Hx     Social History Social History  Substance Use Topics  . Smoking status: Never Smoker  . Smokeless tobacco: Never Used  . Alcohol use No     Comment: Occasional    Review of Systems  Constitutional:   No fever or chills.  ENT:   No sore throat. No rhinorrhea. Cardiovascular:   No chest pain. Respiratory:   No dyspnea or cough. Gastrointestinal:   Negative for abdominal pain, vomiting and diarrhea.  Neurological:   Migraine headache as above 10-point ROS otherwise negative.  ____________________________________________   PHYSICAL EXAM:  VITAL SIGNS: ED Triage  Vitals  Enc Vitals Group     BP 05/29/16 1310 (!) 141/86     Pulse Rate 05/29/16 1310 85     Resp 05/29/16 1310 18     Temp 05/29/16 1310 97.7 F (36.5 C)     Temp Source 05/29/16 1310 Oral     SpO2 05/29/16 1310 99 %     Weight 05/29/16 1311 200 lb (90.7 kg)     Height 05/29/16 1311 5\' 4"  (1.626 m)     Head Circumference --      Peak Flow --      Pain Score 05/29/16 1313 9     Pain Loc --      Pain Edu? --  Excl. in GC? --     Vital signs reviewed, nursing assessments reviewed.   Constitutional:   Alert and oriented. Well appearing and in no distress. Eyes:   No scleral icterus. No conjunctival pallor. PERRL. EOMI.  No nystagmus.Positive photophobia ENT   Head:   Normocephalic and atraumatic.   Nose:   No congestion/rhinnorhea. No septal hematoma   Mouth/Throat:   MMM, no pharyngeal erythema. No peritonsillar mass.    Neck:   No stridor. No SubQ emphysema. No meningismus. Hematological/Lymphatic/Immunilogical:   No cervical lymphadenopathy. Cardiovascular:   RRR. Symmetric bilateral radial and DP pulses.  No murmurs.  Respiratory:   Normal respiratory effort without tachypnea nor retractions. Breath sounds are clear and equal bilaterally. No wheezes/rales/rhonchi. Musculoskeletal:   Nontender with normal range of motion in all extremities. No joint effusions.  No lower extremity tenderness.  No edema. Neurologic:   Normal speech and language.  CN 2-10 normal. Motor grossly intact. No gross focal neurologic deficits are appreciated.   ____________________________________________    LABS (pertinent positives/negatives) (all labs ordered are listed, but only abnormal results are displayed) Labs Reviewed - No data to display ____________________________________________   EKG    ____________________________________________    RADIOLOGY   Electronic medical record reviewed, patient has negative head CT within the last few  years. ____________________________________________   PROCEDURES Procedures  ____________________________________________   INITIAL IMPRESSION / ASSESSMENT AND PLAN / ED COURSE  Pertinent labs & imaging results that were available during my care of the patient were reviewed by me and considered in my medical decision making (see chart for details).  Well-appearing no acute distress, sitting upright and conversant. GCS 15. Denies nausea or vomiting currently. We'll attempt to abort the migraine with subcutaneous Imitrex. If she does not have rapid resolution of symptoms will move onto other strategies for symptom relief.Considering the patient's symptoms, medical history, and physical examination today, I have low suspicion for ischemic stroke, intracranial hemorrhage, meningitis, encephalitis, carotid or vertebral dissection, venous sinus thrombosis, MS, intracranial hypertension, glaucoma, CRAO, CRVO, or temporal arteritis. No labs or imaging indicated at this time.   Clinical Course    ----------------------------------------- 3:03 PM on 05/29/2016 -----------------------------------------  Symptoms resolved. We'll discharge home. Imitrex refill provided. ____________________________________________   FINAL CLINICAL IMPRESSION(S) / ED DIAGNOSES  Final diagnoses:  Migraine with aura and without status migrainosus, not intractable       Portions of this note were generated with dragon dictation software. Dictation errors may occur despite best attempts at proofreading.    Sharman Cheek, MD 05/29/16 [phone]

## 2016-05-29 NOTE — ED Notes (Signed)
Pt complains of a migraine starting this morning, pt reports pain is the same as previous migraines, pt denies nausea and vomiting,

## 2016-05-29 NOTE — ED Triage Notes (Signed)
Pt reports " I have a migraine" states it started in the middle of the night. Denies nausea, vomiting. C/o light sensitivity. Hx of Migraine. Tried Excedrin Migraine and Motrin with no relief.

## 2016-06-02 ENCOUNTER — Encounter: Payer: Self-pay | Admitting: Emergency Medicine

## 2016-06-02 ENCOUNTER — Emergency Department
Admission: EM | Admit: 2016-06-02 | Discharge: 2016-06-02 | Disposition: A | Discharge status: Home / Self Care | Payer: Self-pay | Attending: Emergency Medicine | Admitting: Emergency Medicine

## 2016-06-02 DIAGNOSIS — Z791 Long term (current) use of non-steroidal anti-inflammatories (NSAID): Secondary | ICD-10-CM | POA: Insufficient documentation

## 2016-06-02 DIAGNOSIS — J029 Acute pharyngitis, unspecified: Secondary | ICD-10-CM | POA: Insufficient documentation

## 2016-06-02 DIAGNOSIS — J45909 Unspecified asthma, uncomplicated: Secondary | ICD-10-CM | POA: Insufficient documentation

## 2016-06-02 DIAGNOSIS — Z79899 Other long term (current) drug therapy: Secondary | ICD-10-CM | POA: Insufficient documentation

## 2016-06-02 LAB — POCT RAPID STREP A: Streptococcus, Group A Screen (Direct): NEGATIVE

## 2016-06-02 MED ORDER — AMOXICILLIN 875 MG PO TABS: 875.0000 mg | ORAL_TABLET | Freq: Two times a day (BID) | ORAL | 0 refills | Status: DC

## 2016-06-02 MED ORDER — MAGIC MOUTHWASH W/LIDOCAINE: 5.0000 mL | Freq: Four times a day (QID) | ORAL | 0 refills | Status: DC

## 2016-06-02 NOTE — ED Triage Notes (Signed)
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 Tonsils swollen with pustules

## 2016-06-02 NOTE — Discharge Instructions (Signed)
Take antibiotics until completely finished. Amoxicillin 875 mg twice a day for 10 days. Increase fluids, take Tylenol for fever and for throat pain. [facility] if any continued problems with her throat.

## 2016-06-02 NOTE — ED Provider Notes (Signed)
[facility] Emergency Department Provider Note  ____________________________________________   First MD Initiated Contact with Patient 06/02/16 1723     (approximate)  I have reviewed the triage vital signs and the nursing notes.   HISTORY  Chief Complaint Sore Throat   HPI Makayla Duke is a 40 y.o. female is here with complaint of sore throat. Patient states that it started suddenly and has progressed until she is having great deal of pain. She also states that she had a fever of 102 last evening. 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 Patient states that she frequently has strep throat. Currently she does not have a primary care doctor. Patient has continued to eat and drink and is able to speak without any difficulty. Currently she rates her pain as a 10 over 10.   Past Medical History:  Diagnosis Date  . Cervicalgia    ([place])  . Childhood asthma   . Elevated blood pressure reading without diagnosis of hypertension   . Migraines   . Panic attacks   . Psoriasis   . Sleep apnea   . Tension headache     Patient Active Problem List   Diagnosis Date Noted  . Cervical high risk HPV (human papillomavirus) test positive 09/22/2014  . Psoriasis 09/20/2014  . Migraine 07/05/2011  . Tension headache   . Panic attacks     Past Surgical History:  Procedure Laterality Date  . CESAREAN SECTION  2007; 2009    Prior to Admission medications   Medication Sig Start Date End Date Taking? Authorizing Provider  acetaminophen (TYLENOL) 500 MG tablet Take 500 mg by mouth every 6 (six) hours as needed.    Historical Provider, MD  Adalimumab (HUMIRA PEN-CROHNS STARTER) 40 MG/0.8ML PNKT Inject 40 mg into the skin every 7 (seven) days.    Historical Provider, MD  albuterol (PROVENTIL HFA;VENTOLIN HFA) 108 (90 Base) MCG/ACT inhaler Inhale 2  puffs into the lungs every 6 (six) hours as needed for wheezing or shortness of breath. 02/08/16   Chinita Pesterari B Triplett, FNP  amoxicillin (AMOXIL) 875 MG tablet Take 1 tablet (875 mg total) by mouth 2 (two) times daily. 06/02/16   Tommi Rumpshonda L Sruthi Maurer, PA-C  loratadine (CLARITIN) 10 MG tablet Take 1 tablet (10 mg total) by mouth daily. 12/26/15   Jami L Hagler, PA-C  magic mouthwash SOLN Take 5 mLs by mouth 4 (four) times daily as needed for mouth pain. Please mix equal amounts of each ingredient totaling 180mL 12/26/15   Jami L Hagler, PA-C  magic mouthwash w/lidocaine SOLN Take 5 mLs by mouth 4 (four) times daily. 06/02/16   Tommi Rumpshonda L Sheniece Ruggles, PA-C  sertraline (ZOLOFT) 50 MG tablet Take 50 mg by mouth daily.    Historical Provider, MD  SUMAtriptan (IMITREX) 100 MG tablet Take 1 tablet (100 mg total) by mouth once as needed for migraine. May repeat in 2 hours if headache persists or recurs. 05/29/16 05/30/17  Sharman CheekPhillip Stafford, MD    Allergies Review of patient's allergies indicates no known allergies.  Family History  Problem Relation Age of Onset  . Hypertension Father   . Coronary artery disease Father [id number]    MI  . Arthritis Mother   . Migraines Mother   . Psoriasis Mother   . Breast cancer Paternal Aunt   . Cancer Paternal Aunt  breast  . Lung cancer Maternal Grandfather   . Cancer Maternal Grandfather 60    lung, smoker  . Hypertension Maternal Uncle   . Diabetes    . Lung cancer Paternal Grandmother   . Cancer Paternal Grandmother 40    lung, smoker  . Mental illness Paternal Grandmother     schizophrenia  . Mental illness Paternal Uncle     bipolar  . Stroke Neg Hx     Social History Social History  Substance Use Topics  . Smoking status: Never Smoker  . Smokeless tobacco: Never Used  . Alcohol use No     Comment: Occasional    Review of Systems Constitutional: Positive fever/chills Eyes: No visual changes. ENT: Positive sore throat. Cardiovascular: Denies chest  pain. Respiratory: Denies shortness of breath. Gastrointestinal: No abdominal pain.  No nausea, no vomiting.   Musculoskeletal: Negative for back pain. Skin: Negative for rash. Neurological: Negative for headaches, focal weakness or numbness.  10-point ROS otherwise negative.  ____________________________________________   PHYSICAL EXAM:  VITAL SIGNS: ED Triage Vitals  Enc Vitals Group     BP 06/02/16 1714 (!) 148/86     Pulse Rate 06/02/16 1714 (!) 107     Resp 06/02/16 1714 16     Temp 06/02/16 1714 99.6 F (37.6 C)     Temp Source 06/02/16 1714 Oral     SpO2 06/02/16 1714 95 %     Weight 06/02/16 1714 200 lb (90.7 kg)     Height 06/02/16 1714 5\' 4"  (1.626 m)     Head Circumference --      Peak Flow --      Pain Score 06/02/16 1715 10     Pain Loc --      Pain Edu? --      Excl. in GC? --     Constitutional: Alert and oriented. Well appearing and in no acute distress. Eyes: Conjunctivae are normal. PERRL. EOMI. Head: Atraumatic. Nose: No congestion/rhinnorhea. Mouth/Throat: Mucous membranes are moist.  Oropharynx non-erythematous.Posterior pharynx is erythematous but no exudate was noted. Neck: No stridor.   Hematological/Lymphatic/Immunilogical: Minimal cervical lymphadenopathy. Cardiovascular: Normal rate, regular rhythm. Grossly normal heart sounds.  Good peripheral circulation. Respiratory: Normal respiratory effort.  No retractions. Lungs CTAB. Musculoskeletal: No lower extremity tenderness nor edema.  No joint effusions. Neurologic:  Normal speech and language. No gross focal neurologic deficits are appreciated. No gait instability. Skin:  Skin is warm, dry and intact. No rash noted. Psychiatric: Mood and affect are normal. Speech and behavior are normal.  ____________________________________________   LABS (all labs ordered are listed, but only abnormal results are displayed)  Labs Reviewed  CULTURE, GROUP A STREP [facility])  POCT RAPID STREP A      PROCEDURES  Procedure(s) performed: None  Procedures  Critical Care performed: No  ____________________________________________   INITIAL IMPRESSION / ASSESSMENT AND PLAN / ED COURSE  Pertinent labs & imaging results that were available during my care of the patient were reviewed by me and considered in my medical decision making (see chart for details).    Clinical Course  Patient started on amoxicillin 875 twice a day for 10 days. They should also requested some pain medication for her throat and was given a prescription for Magic mouthwash with lidocaine to use before meals and at bedtime. She is also to follow-up with Dr. Willeen Cass says she does not have a primary care doctor if she has any further problems with her throat.   ____________________________________________  FINAL CLINICAL IMPRESSION(S) / ED DIAGNOSES  Final diagnoses:  Acute pharyngitis, unspecified etiology      NEW MEDICATIONS STARTED DURING THIS VISIT:  Discharge Medication List as of 06/02/2016  5:57 PM       Note:  This document was prepared using Dragon voice recognition software and may include unintentional dictation errors.    Tommi Rumps, PA-C 06/02/16 1820    Governor Rooks, MD 06/06/16 [phone]

## 2016-06-04 LAB — CULTURE, GROUP A STREP (THRC)

## 2016-06-15 ENCOUNTER — Encounter: Payer: Self-pay | Admitting: Emergency Medicine

## 2016-06-15 ENCOUNTER — Emergency Department
Admission: EM | Admit: 2016-06-15 | Discharge: 2016-06-16 | Disposition: A | Discharge status: Home / Self Care | Payer: Self-pay | Attending: Emergency Medicine | Admitting: Emergency Medicine

## 2016-06-15 DIAGNOSIS — J45909 Unspecified asthma, uncomplicated: Secondary | ICD-10-CM | POA: Insufficient documentation

## 2016-06-15 DIAGNOSIS — G43809 Other migraine, not intractable, without status migrainosus: Secondary | ICD-10-CM | POA: Insufficient documentation

## 2016-06-15 MED ORDER — ONDANSETRON 4 MG PO TBDP
ORAL_TABLET | ORAL | Status: AC
  Filled 2016-06-15: qty 1

## 2016-06-15 MED ORDER — ONDANSETRON 4 MG PO TBDP
4.0000 mg | ORAL_TABLET | Freq: Once | ORAL | Status: AC | PRN
  Administered 2016-06-15: 4 mg via ORAL

## 2016-06-15 NOTE — ED Notes (Signed)
Had to awake pt from deep sleep for assessment. Pt requesting po fluids on awakening. Pt complains of "migraine" with emesis and nausea for 2 days. Pt states has photophobia and sensitivity to sound. Pt flexing neck without difficulty. Skin normal color warm and dry. resps unlabored. Pt states improvement in nausea since zofran administration. Pt would not keep eyes open long enough to check pupil response. No rash noted. Pt denies known fever but states has had chills. Pt moving all extremities without difficulty.

## 2016-06-15 NOTE — ED Triage Notes (Signed)
Patient states that she woke up with a migraine and that it has progressively become worse throughout the day. The  Patient states that she has taken tylenol and Imitrex with no improvement. Patient with nausea and vomiting also.

## 2016-06-16 MED ORDER — PROCHLORPERAZINE MALEATE 10 MG PO TABS: 10.0000 mg | ORAL_TABLET | Freq: Four times a day (QID) | ORAL | 0 refills | Status: DC | PRN

## 2016-06-16 MED ORDER — DIPHENHYDRAMINE HCL 50 MG/ML IJ SOLN
12.5000 mg | Freq: Once | INTRAMUSCULAR | Status: AC
  Administered 2016-06-16: 12.5 mg via INTRAVENOUS
  Filled 2016-06-16: qty 1

## 2016-06-16 MED ORDER — PROCHLORPERAZINE EDISYLATE 5 MG/ML IJ SOLN
5.0000 mg | Freq: Once | INTRAMUSCULAR | Status: AC
  Administered 2016-06-16: 5 mg via INTRAVENOUS
  Filled 2016-06-16: qty 2

## 2016-06-16 MED ORDER — DEXTROSE-NACL 5-0.45 % IV SOLN
INTRAVENOUS | Status: DC
  Administered 2016-06-16: 01:00:00 via INTRAVENOUS

## 2016-06-16 NOTE — Discharge Instructions (Signed)
1. You may take Compazine as needed for headache and nausea. °2. Return to the ER for worsening symptoms, persistent vomiting, difficulty breathing or other concerns. °

## 2016-06-16 NOTE — ED Notes (Signed)
Spoke with Celso SickleLaurie RN, and charge. All RNs agreed pt safe to drive home. Pt AO X 4 with normal speech, gait, and reactions.

## 2016-06-16 NOTE — ED Notes (Signed)
Report to jenna, rn.  

## 2016-06-16 NOTE — ED Provider Notes (Signed)
[facility] Emergency Department Provider Note   ____________________________________________   First MD Initiated Contact with Patient 06/16/16 0034     (approximate)  I have reviewed the triage vital signs and the nursing notes.   HISTORY  Chief Complaint Migraine    HPI Makayla Duke is a 40 y.o. female who presents to the ED from home with a chief complaint of migraine headache. Patient has a history of frequent migraineswho reports typical migraine headache upon awakening yesterday which became progressively worse throughout the day. She had no relief with taking 2 doses of Imitrex. Describes global headache associated with photophobia, nausea and vomiting. Denies recent fever, chills, chest pain, shortness of breath, abdominal pain, diarrhea. She was recently treated with antibiotic for pharyngitis and URI. Denies recent travel or trauma. Nothing makes her symptoms better or worse.   Past Medical History:  Diagnosis Date  . Cervicalgia    ([place])  . Childhood asthma   . Elevated blood pressure reading without diagnosis of hypertension   . Migraines   . Panic attacks   . Psoriasis   . Sleep apnea   . Tension headache     Patient Active Problem List   Diagnosis Date Noted  . Cervical high risk HPV (human papillomavirus) test positive 09/22/2014  . Psoriasis 09/20/2014  . Migraine 07/05/2011  . Tension headache   . Panic attacks     Past Surgical History:  Procedure Laterality Date  . CESAREAN SECTION  2007; 2009    Prior to Admission medications   Medication Sig Start Date End Date Taking? Authorizing Provider  acetaminophen (TYLENOL) 500 MG tablet Take 500 mg by mouth every 6 (six) hours as needed.    Historical Provider, MD  Adalimumab (HUMIRA PEN-CROHNS STARTER) 40 MG/0.8ML PNKT Inject 40 mg into the skin every 7 (seven) days.    Historical Provider, MD  albuterol (PROVENTIL HFA;VENTOLIN HFA) 108 (90 Base) MCG/ACT inhaler  Inhale 2 puffs into the lungs every 6 (six) hours as needed for wheezing or shortness of breath. 02/08/16   Chinita Pesterari B Triplett, FNP  amoxicillin (AMOXIL) 875 MG tablet Take 1 tablet (875 mg total) by mouth 2 (two) times daily. 06/02/16   Tommi Rumpshonda L Summers, PA-C  loratadine (CLARITIN) 10 MG tablet Take 1 tablet (10 mg total) by mouth daily. 12/26/15   Jami L Hagler, PA-C  magic mouthwash SOLN Take 5 mLs by mouth 4 (four) times daily as needed for mouth pain. Please mix equal amounts of each ingredient totaling 180mL 12/26/15   Jami L Hagler, PA-C  magic mouthwash w/lidocaine SOLN Take 5 mLs by mouth 4 (four) times daily. 06/02/16   Tommi Rumpshonda L Summers, PA-C  prochlorperazine (COMPAZINE) 10 MG tablet Take 1 tablet (10 mg total) by mouth every 6 (six) hours as needed for nausea. 06/16/16   Irean HongJade J Tomekia Helton, MD  sertraline (ZOLOFT) 50 MG tablet Take 50 mg by mouth daily.    Historical Provider, MD  SUMAtriptan (IMITREX) 100 MG tablet Take 1 tablet (100 mg total) by mouth once as needed for migraine. May repeat in 2 hours if headache persists or recurs. 05/29/16 05/30/17  Sharman CheekPhillip Stafford, MD    Allergies Review of patient's allergies indicates no known allergies.  Family History  Problem Relation Age of Onset  . Hypertension Father   . Coronary artery disease Father [id number]    MI  . Arthritis Mother   . Migraines Mother   . Psoriasis Mother   . Breast cancer Paternal  Aunt   . Cancer Paternal Aunt     breast  . Lung cancer Maternal Grandfather   . Cancer Maternal Grandfather 60    lung, smoker  . Hypertension Maternal Uncle   . Diabetes    . Lung cancer Paternal Grandmother   . Cancer Paternal Grandmother 40    lung, smoker  . Mental illness Paternal Grandmother     schizophrenia  . Mental illness Paternal Uncle     bipolar  . Stroke Neg Hx     Social History Social History  Substance Use Topics  . Smoking status: Never Smoker  . Smokeless tobacco: Never Used  . Alcohol use No     Comment: Occasional      Review of Systems  Constitutional: No fever/chills. Eyes: No visual changes. ENT: No sore throat. Cardiovascular: Denies chest pain. Respiratory: Denies shortness of breath. Gastrointestinal: No abdominal pain.  No nausea, no vomiting.  No diarrhea.  No constipation. Genitourinary: Negative for dysuria. Musculoskeletal: Negative for back pain. Skin: Negative for rash. Neurological: Positive for headache. Negative for focal weakness or numbness.  10-point ROS otherwise negative.  ____________________________________________   PHYSICAL EXAM:  VITAL SIGNS: ED Triage Vitals  Enc Vitals Group     BP 06/15/16 2032 131/82     Pulse Rate 06/15/16 2032 78     Resp 06/15/16 2032 18     Temp --      Temp src --      SpO2 06/15/16 2032 97 %     Weight 06/15/16 2036 200 lb (90.7 kg)     Height 06/15/16 2036 5\' 4"  (1.626 m)     Head Circumference --      Peak Flow --      Pain Score 06/15/16 2036 10     Pain Loc --      Pain Edu? --      Excl. in GC? --     Constitutional: Asleep, awakened for exam. Alert and oriented. Well appearing and in mild acute distress. Eyes: Conjunctivae are normal. PERRL. EOMI. Head: Atraumatic. Nose: No congestion/rhinnorhea. Mouth/Throat: Mucous membranes are moist.  Oropharynx non-erythematous. Neck: No stridor.  Supple neck without meningismus. Cardiovascular: Normal rate, regular rhythm. Grossly normal heart sounds.  Good peripheral circulation. Respiratory: Normal respiratory effort.  No retractions. Lungs CTAB. Gastrointestinal: Soft and nontender. No distention. No abdominal bruits. No CVA tenderness. Musculoskeletal: No lower extremity tenderness nor edema.  No joint effusions. Neurologic:  Alert and oriented 4. Normal speech and language. No gross focal neurologic deficits are appreciated.  Skin:  Skin is warm, dry and intact. No rash noted. Psychiatric: Mood and affect are normal. Speech and behavior are  normal.  ____________________________________________   LABS (all labs ordered are listed, but only abnormal results are displayed)  Labs Reviewed - No data to display ____________________________________________  EKG  None ____________________________________________  RADIOLOGY  None ____________________________________________   PROCEDURES  Procedure(s) performed: None  Procedures  Critical Care performed: No  ____________________________________________   INITIAL IMPRESSION / ASSESSMENT AND PLAN / ED COURSE  Pertinent labs & imaging results that were available during my care of the patient were reviewed by me and considered in my medical decision making (see chart for details).  40 year old female with a history of recurrent migraine headaches who presents with typical migraine headache associated with photophobia, nausea and vomiting. She has no focal neurological deficits on exam and her neck is supple without meningismus. Will initiate IV fluids, Compazine with Benadryl and reassess.  Clinical Course  Comment By Time  Sleeping very soundly. Irean Hong, MD 10/08 0159  Patient now easily awakening. Headache improved. Strict return precautions have an. Patient verbalizes understanding and agrees with plan of care. Irean Hong, MD 10/08 0344    ____________________________________________   FINAL CLINICAL IMPRESSION(S) / ED DIAGNOSES  Final diagnoses:  Other migraine without status migrainosus, not intractable      NEW MEDICATIONS STARTED DURING THIS VISIT:  New Prescriptions   PROCHLORPERAZINE (COMPAZINE) 10 MG TABLET    Take 1 tablet (10 mg total) by mouth every 6 (six) hours as needed for nausea.     Note:  This document was prepared using Dragon voice recognition software and may include unintentional dictation errors.    Irean Hong, MD 06/16/16 [phone]

## 2016-06-16 NOTE — ED Notes (Signed)
Pt reports improved nausea, states pain is not improved. md notified.

## 2016-06-16 NOTE — ED Notes (Signed)
Reviewed d/c instructions, follow-up care and prescription with pt. Pt repeated instructions and verbalized understanding.

## 2016-07-10 ENCOUNTER — Emergency Department: Payer: Medicaid Other

## 2016-07-10 ENCOUNTER — Emergency Department
Admission: EM | Admit: 2016-07-10 | Discharge: 2016-07-10 | Disposition: A | Discharge status: Home / Self Care | Payer: Medicaid Other | Attending: Emergency Medicine | Admitting: Emergency Medicine

## 2016-07-10 DIAGNOSIS — G43009 Migraine without aura, not intractable, without status migrainosus: Secondary | ICD-10-CM | POA: Insufficient documentation

## 2016-07-10 DIAGNOSIS — R197 Diarrhea, unspecified: Secondary | ICD-10-CM | POA: Diagnosis not present

## 2016-07-10 DIAGNOSIS — R05 Cough: Secondary | ICD-10-CM

## 2016-07-10 DIAGNOSIS — R079 Chest pain, unspecified: Secondary | ICD-10-CM

## 2016-07-10 DIAGNOSIS — Z79899 Other long term (current) drug therapy: Secondary | ICD-10-CM | POA: Diagnosis not present

## 2016-07-10 DIAGNOSIS — R059 Cough, unspecified: Secondary | ICD-10-CM

## 2016-07-10 DIAGNOSIS — R0789 Other chest pain: Secondary | ICD-10-CM | POA: Diagnosis not present

## 2016-07-10 DIAGNOSIS — R0602 Shortness of breath: Secondary | ICD-10-CM | POA: Insufficient documentation

## 2016-07-10 MED ORDER — SUMATRIPTAN SUCCINATE 50 MG PO TABS: 50.0000 mg | ORAL_TABLET | Freq: Once | ORAL | 0 refills | Status: DC | PRN

## 2016-07-10 MED ORDER — BENZONATATE 100 MG PO CAPS: 100.0000 mg | ORAL_CAPSULE | Freq: Four times a day (QID) | ORAL | 0 refills | Status: DC | PRN

## 2016-07-10 MED ORDER — METOCLOPRAMIDE HCL 10 MG PO TABS
10.0000 mg | ORAL_TABLET | Freq: Once | ORAL | Status: AC
  Administered 2016-07-10: 10 mg via ORAL
  Filled 2016-07-10: qty 1

## 2016-07-10 MED ORDER — KETOROLAC TROMETHAMINE 60 MG/2ML IM SOLN
60.0000 mg | Freq: Once | INTRAMUSCULAR | Status: DC
Start: 2016-07-10 — End: 2016-07-10
  Filled 2016-07-10: qty 2

## 2016-07-10 MED ORDER — LOPERAMIDE HCL 2 MG PO TABS: 2.0000 mg | ORAL_TABLET | Freq: Four times a day (QID) | ORAL | 0 refills | Status: DC | PRN

## 2016-07-10 NOTE — ED Triage Notes (Signed)
Pt arrives to ER via POV c/o migraine. Hx of migraines, uses Imitrex, but does not have any. States Imitrex usually takes the pain away. PT denies nausea, vomiting or vision changes. Pt also reports sore throat X 2 months, was treated for strep with antibiotics but no better, chest congestion. Pt alert and oriented X4, active, cooperative, pt in NAD. RR even and unlabored, color WNL.

## 2016-07-10 NOTE — ED Notes (Signed)
Pt denies that she had a callback to report that culture of strep test was positive. POC strep negative in ER.

## 2016-07-10 NOTE — Discharge Instructions (Signed)
Please call one of the clinics listed above to establish a primary care physician for follow-up.  Drink plenty of fluid and stay well rested to prevent migraine. If you develop migraine, treat your symptoms early on as it is much easier to improve your headache pain if he do not wait until it is full-blown.  Return to the emergency department if you develop severe pain, chest pain, shortness of breath, lightheadedness or fainting, fever, or any other symptoms concerning to you.

## 2016-07-10 NOTE — ED Provider Notes (Signed)
[facility] Emergency Department Provider Note  ____________________________________________  Time seen: Approximately 5:27 PM  I have reviewed the triage vital signs and the nursing notes.   HISTORY  Chief Complaint Migraine    HPI Makayla Duke is a 40 y.o. female with a history of migraines presenting with chest pressure, shortness of breath, and migraine. The patient reports that 2 months ago she was seen and treated with antibiotics for strep pharyngitis. After antibiotics, her fevers and chills resolved, and her cough improved. Intermittently she has had no cough at all, but then the cough starts back up. She has a mostly nonproductive cough, but the sensation that she needs to cough something up but is unable to get it out. She denies any fevers or chills in the recent weeks. In addition, since her treatment for strep, the patient describes a sensation of "the air feels thick." This is not worse with exertion or laying down. She also has a central chest pressure "all the time" which is not variable with food, position, exertion. She has been unable to see a primary care physician due to insurance issues.For the past several days, the patient has had a migraine that is typical for her and only mildly improved with Excedrin Migraine and ibuprofen. Normally, she takes sumatriptan for her migraines, but has run out of her prescription and as per above, does not have a primary care physician at this time.   Past Medical History:  Diagnosis Date  . Cervicalgia    ([place])  . Childhood asthma   . Elevated blood pressure reading without diagnosis of hypertension   . Migraines   . Panic attacks   . Psoriasis   . Sleep apnea   . Tension headache     Patient Active Problem List   Diagnosis Date Noted  . Cervical high risk HPV (human papillomavirus) test positive 09/22/2014  . Psoriasis 09/20/2014  . Migraine 07/05/2011  . Tension headache   . Panic  attacks     Past Surgical History:  Procedure Laterality Date  . CESAREAN SECTION  2007; 2009    Current Outpatient Rx  . Order #: [id number] Class: Historical Med  . Order #: [id number] Class: Historical Med  . Order #: [id number] Class: Print  . Order #: [id number] Class: Print  . Order #: [id number] Class: Print  . Order #: [id number] Class: Print  . Order #: [id number] Class: Print  . Order #: [id number] Class: Print  . Order #: [id number] Class: Historical Med  . Order #: [id number] Class: Print    Allergies Review of patient's allergies indicates no known allergies.  Family History  Problem Relation Age of Onset  . Hypertension Father   . Coronary artery disease Father 6651    MI  . Arthritis Mother   . Migraines Mother   . Psoriasis Mother   . Breast cancer Paternal Aunt   . Cancer Paternal Aunt     breast  . Lung cancer Maternal Grandfather   . Cancer Maternal Grandfather 60    lung, smoker  . Hypertension Maternal Uncle   . Diabetes    . Lung cancer Paternal Grandmother   . Cancer Paternal Grandmother 40    lung, smoker  . Mental illness Paternal Grandmother     schizophrenia  . Mental illness Paternal Uncle     bipolar  . Stroke Neg Hx     Social History Social History  Substance Use Topics  . Smoking status: Never Smoker  . Smokeless tobacco: Never Used  . Alcohol use  No     Comment: Occasional    Review of Systems Constitutional: No fever/chills.No lightheadedness or syncope. Eyes: No visual changes. No eye discharge. ENT: No sore throat. No congestion or rhinorrhea. Cardiovascular: Positive chest pain. Denies palpitations. Respiratory: Denies shortness of breath.  Positive improving intermittently productive cough. Gastrointestinal: No abdominal pain.  No nausea, no vomiting.  Positive intermittent diarrhea.  No constipation. Genitourinary: Negative for dysuria. Musculoskeletal: Negative for back pain. Skin: Negative for rash. Neurological: Positive for  headaches. No focal numbness, tingling or weakness.   10-point ROS otherwise negative.  ____________________________________________   PHYSICAL EXAM:  VITAL SIGNS: ED Triage Vitals  Enc Vitals Group     BP 07/10/16 1558 (!) 146/96     Pulse Rate 07/10/16 1558 92     Resp 07/10/16 1558 18     Temp 07/10/16 1558 98 F (36.7 C)     Temp Source 07/10/16 1558 Oral     SpO2 07/10/16 1558 98 %     Weight 07/10/16 1559 195 lb (88.5 kg)     Height 07/10/16 1559 5\' 4"  (1.626 m)     Head Circumference --      Peak Flow --      Pain Score 07/10/16 1559 9     Pain Loc --      Pain Edu? --      Excl. in GC? --     Constitutional: Alert and oriented. Well appearing and in no acute distress. Answers questions appropriately. Eyes: Conjunctivae are normal.  EOMI.PERRLA No scleral icterus.No eye discharge. EARS: TMs are clear without erythema bulge or fluid bilaterally. The canals are clear as well. Head: Atraumatic. No frontal or maxillary sinus pressure or tenderness on palpation. Nose: No congestion/rhinnorhea. Mouth/Throat: Mucous membranes are moist. No posterior pharyngeal erythema, tonsillar swelling or exudate. The posterior palate is symmetric and the uvula is midline. Neck: No stridor.  Supple.  No JVD. No meningismus. Full range of motion without pain. Cardiovascular: Normal rate, regular rhythm. No murmurs, rubs or gallops.  Respiratory: Normal respiratory effort.  No accessory muscle use or retractions. Lungs CTAB.  No wheezes, rales or ronchi. Gastrointestinal: Soft, nontender and nondistended.  No guarding or rebound.  No peritoneal signs. Musculoskeletal: No LE edema.  Neurologic:  A&Ox3.  Speech is clear.  Face and smile are symmetric.  EOMI.  PERRLA. Moves all extremities well. Gait is normal without ataxia. Skin:  Skin is warm, dry and intact. No rash noted. Psychiatric: Mood and affect are normal. Speech and behavior are normal.  Normal  judgement.  ____________________________________________   LABS (all labs ordered are listed, but only abnormal results are displayed)  Labs Reviewed - No data to display ____________________________________________  EKG  ED ECG REPORT I, Rockne MenghiniNorman, Anne-Caroline, the attending physician, personally viewed and interpreted this ECG.   Date: 07/10/2016  EKG Time: 1813  Rate: 84  Rhythm: normal sinus rhythm  Axis: normal  Intervals:none  ST&T Change: Nonspecific T-wave inversion in V1. No ST elevation.  ____________________________________________  RADIOLOGY  No results found.  ____________________________________________   PROCEDURES  Procedure(s) performed: None  Procedures  Critical Care performed: No ____________________________________________   INITIAL IMPRESSION / ASSESSMENT AND PLAN / ED COURSE  Pertinent labs & imaging results that were available during my care of the patient were reviewed by me and considered in my medical decision making (see chart for details).  40 y.o. female presenting for a sensation of breathing "thick air" with associated chest pressure that has been constant  for almost 2 months, as well as intermittent cough, intermittent diarrhea, and an ongoing acute typical migraine. Overall, the patient is well-appearing and has reassuring vital signs. She has no focal neurologic findings on my examination. For her cough, we'll get a chest x-ray, although it is possible that the patient may have intermittent viral syndromes, seasonal allergies, or GERD causing her symptoms. She has no cardiac risk factors but we'll do a screening EKG to look for arrhythmia or other abnormalities that might cause chest pain or shortness breath. Will also get a chest x-ray to rule out any acute cardiopulmonary process. For the patient's migraine, which her with Toradol and Reglan. The patient has deferred any blood work due to the cost, and given that her symptoms have  been going on for 2 months, I do not think that it is emergent to get blood work on this patient. She will follow-up in one of the local clinics that sees patients without insurance for further treatment and evaluation.  ____________________________________________  FINAL CLINICAL IMPRESSION(S) / ED DIAGNOSES  Final diagnoses:  None    Clinical Course      NEW MEDICATIONS STARTED DURING THIS VISIT:  New Prescriptions   No medications on file      Rockne Menghini, MD 07/10/16 1820

## 2016-07-10 NOTE — ED Notes (Signed)
Patient transported to X-ray 

## 2016-07-10 NOTE — ED Notes (Signed)
Pt states she has had cough and cold symptoms "off and on" for about 2 months now and c/o of chest pain related to cough.

## 2016-08-06 ENCOUNTER — Emergency Department
Admission: EM | Admit: 2016-08-06 | Discharge: 2016-08-06 | Disposition: A | Discharge status: Home / Self Care | Payer: Medicaid Other | Attending: Emergency Medicine | Admitting: Emergency Medicine

## 2016-08-06 DIAGNOSIS — Z79899 Other long term (current) drug therapy: Secondary | ICD-10-CM | POA: Diagnosis not present

## 2016-08-06 DIAGNOSIS — J45909 Unspecified asthma, uncomplicated: Secondary | ICD-10-CM | POA: Diagnosis not present

## 2016-08-06 DIAGNOSIS — G43809 Other migraine, not intractable, without status migrainosus: Secondary | ICD-10-CM | POA: Insufficient documentation

## 2016-08-06 DIAGNOSIS — G43909 Migraine, unspecified, not intractable, without status migrainosus: Secondary | ICD-10-CM | POA: Diagnosis present

## 2016-08-06 MED ORDER — SODIUM CHLORIDE 0.9 % IV SOLN: Freq: Once | INTRAVENOUS | Status: DC

## 2016-08-06 MED ORDER — ONDANSETRON 4 MG PO TBDP
4.0000 mg | ORAL_TABLET | Freq: Once | ORAL | Status: AC
Start: 2016-08-06 — End: 2016-08-06
  Administered 2016-08-06: 4 mg via ORAL
  Filled 2016-08-06: qty 1

## 2016-08-06 MED ORDER — SUMATRIPTAN SUCCINATE 6 MG/0.5ML ~~LOC~~ SOLN
6.0000 mg | Freq: Once | SUBCUTANEOUS | Status: AC
  Administered 2016-08-06: 6 mg via SUBCUTANEOUS
  Filled 2016-08-06: qty 0.5

## 2016-08-06 MED ORDER — ONDANSETRON 4 MG PO TBDP: 4.0000 mg | ORAL_TABLET | Freq: Three times a day (TID) | ORAL | 0 refills | Status: DC | PRN

## 2016-08-06 MED ORDER — DIPHENHYDRAMINE HCL 50 MG/ML IJ SOLN: 25.0000 mg | Freq: Once | INTRAMUSCULAR | Status: DC

## 2016-08-06 MED ORDER — METOCLOPRAMIDE HCL 5 MG/ML IJ SOLN: 10.0000 mg | Freq: Once | INTRAMUSCULAR | Status: DC

## 2016-08-06 MED ORDER — LORAZEPAM 2 MG/ML IJ SOLN: 1.0000 mg | Freq: Once | INTRAMUSCULAR | Status: DC

## 2016-08-06 MED ORDER — KETOROLAC TROMETHAMINE 30 MG/ML IJ SOLN: 30.0000 mg | Freq: Once | INTRAMUSCULAR | Status: DC

## 2016-08-06 MED ORDER — SUMATRIPTAN SUCCINATE 100 MG PO TABS: 100.0000 mg | ORAL_TABLET | Freq: Once | ORAL | 2 refills | Status: AC | PRN

## 2016-08-06 NOTE — ED Triage Notes (Signed)
Pt c/o migraine with N/V since around 4am states she has taken Excedrin migraine without relief..Marland Kitchen

## 2016-08-06 NOTE — ED Provider Notes (Signed)
[facility] Emergency Department Provider Note        Time seen: ----------------------------------------- 8:27 AM on 08/06/2016 -----------------------------------------    I have reviewed the triage vital signs and the nursing notes.   HISTORY  Chief Complaint Migraine    HPI Makayla MormonBrandi Jeanne Duke is a 40 y.o. female who presents to the ER with headache as well as nausea and vomiting since 4 AM this morning. Pain she states is behind her left eye. Patient has taken Excedrin migraine this morning without any relief in her headache. Currently the pain is 8 out of 10. She describes as her typical migraine headache.In the past she has taken Imitrex with dramatic improvement in her symptoms.   Past Medical History:  Diagnosis Date  . Cervicalgia    ([place])  . Childhood asthma   . Elevated blood pressure reading without diagnosis of hypertension   . Migraines   . Panic attacks   . Psoriasis   . Sleep apnea   . Tension headache     Patient Active Problem List   Diagnosis Date Noted  . Cervical high risk HPV (human papillomavirus) test positive 09/22/2014  . Psoriasis 09/20/2014  . Migraine 07/05/2011  . Tension headache   . Panic attacks     Past Surgical History:  Procedure Laterality Date  . CESAREAN SECTION  2007; 2009    Allergies Patient has no known allergies.  Social History Social History  Substance Use Topics  . Smoking status: Never Smoker  . Smokeless tobacco: Never Used  . Alcohol use No     Comment: Occasional    Review of Systems Constitutional: Negative for fever. Cardiovascular: Negative for chest pain. Respiratory: Negative for shortness of breath. Gastrointestinal: Negative for abdominal pain, Positive for vomiting Genitourinary: Negative for dysuria. Musculoskeletal: Negative for back pain. Skin: Negative for rash. Neurological: Positive for headache  10-point ROS otherwise  negative.  ____________________________________________   PHYSICAL EXAM:  VITAL SIGNS: ED Triage Vitals  Enc Vitals Group     BP 08/06/16 0820 136/89     Pulse Rate 08/06/16 0820 90     Resp 08/06/16 0820 16     Temp 08/06/16 0820 97.4 F (36.3 C)     Temp Source 08/06/16 0820 Oral     SpO2 08/06/16 0820 95 %     Weight --      Height --      Head Circumference --      Peak Flow --      Pain Score 08/06/16 0814 8     Pain Loc --      Pain Edu? --      Excl. in GC? --     Constitutional: Alert and oriented. Well appearing and in no distress. Eyes: Conjunctivae are normal. PERRL. Normal extraocular movements. Mild photophobia ENT   Head: Normocephalic and atraumatic.   Nose: No congestion/rhinnorhea.   Mouth/Throat: Mucous membranes are moist.   Neck: No stridor. Cardiovascular: Normal rate, regular rhythm. No murmurs, rubs, or gallops. Respiratory: Normal respiratory effort without tachypnea nor retractions. Breath sounds are clear and equal bilaterally. No wheezes/rales/rhonchi. Gastrointestinal: Soft and nontender. Normal bowel sounds Musculoskeletal: Nontender with normal range of motion in all extremities. No lower extremity tenderness nor edema. Neurologic:  Normal speech and language. No gross focal neurologic deficits are appreciated.  Skin:  Skin is warm, dry and intact. No rash noted. Psychiatric: Mood and affect are normal. Speech and behavior are normal.  ____________________________________________  ED COURSE:  Pertinent labs & imaging results that were available during my care of the patient were reviewed by me and considered in my medical decision making (see chart for details). Clinical Course   Patients in no distress, she is requesting Imitrex which we will give subcutaneously and oral Zofran.  Procedures  ____________________________________________  FINAL ASSESSMENT AND PLAN  Headache  Plan: Patient presented with her typical  migraine headache. She was Imitrex and Zofran with improvement in her symptoms. Patient with approximately monthly presentations for same this year. I will prescribe Imitrex for her to take as an outpatient.   Emily FilbertWilliams, Maryclaire Stoecker E, MD   Note: This dictation was prepared with Dragon dictation. Any transcriptional errors that result from this process are unintentional    Emily FilbertJonathan E Arsal Tappan, MD 08/06/16 [phone]

## 2016-09-15 ENCOUNTER — Emergency Department
Admission: EM | Admit: 2016-09-15 | Discharge: 2016-09-15 | Disposition: A | Discharge status: Home / Self Care | Payer: Medicaid Other | Attending: Emergency Medicine | Admitting: Emergency Medicine

## 2016-09-15 ENCOUNTER — Encounter: Payer: Self-pay | Admitting: Emergency Medicine

## 2016-09-15 DIAGNOSIS — R51 Headache: Secondary | ICD-10-CM | POA: Diagnosis present

## 2016-09-15 DIAGNOSIS — Z79899 Other long term (current) drug therapy: Secondary | ICD-10-CM | POA: Diagnosis not present

## 2016-09-15 DIAGNOSIS — J0110 Acute frontal sinusitis, unspecified: Secondary | ICD-10-CM | POA: Insufficient documentation

## 2016-09-15 MED ORDER — AMOXICILLIN-POT CLAVULANATE 875-125 MG PO TABS: 1.0000 | ORAL_TABLET | Freq: Two times a day (BID) | ORAL | 0 refills | Status: AC

## 2016-09-15 MED ORDER — SUMATRIPTAN SUCCINATE 50 MG PO TABS: 50.0000 mg | ORAL_TABLET | Freq: Once | ORAL | 0 refills | Status: DC | PRN

## 2016-09-15 NOTE — Discharge Instructions (Signed)
Please seek medical attention for any high fevers, chest pain, shortness of breath, change in behavior, persistent vomiting, bloody stool or any other new or concerning symptoms.  

## 2016-09-15 NOTE — ED Triage Notes (Signed)
C/O headaches for the past couple of weeks and some blood when blowing nose.  Describes headaches beginning in forehead and face, then turn into migraine headache.

## 2016-09-15 NOTE — ED Provider Notes (Signed)
[facility] Emergency Department Provider Note   ____________________________________________   I have reviewed the triage vital signs and the nursing notes.   HISTORY  Chief Complaint Headache   History limited by: Not Limited   HPI Makayla Duke is a 41 y.o. female who presents to the emergency department today because of concerns for continued headache. The patient states that she has been seen a few times for headaches. She states that for the past almost a month however she has had a somewhat consistent headache on the right side of her head. She is concerned she might have sinusitis because she is having fairly large amount of rhinorrhea with some blood. The patient has not appreciated any fevers.    Past Medical History:  Diagnosis Date  . Cervicalgia    ([place])  . Childhood asthma   . Elevated blood pressure reading without diagnosis of hypertension   . Migraines   . Panic attacks   . Psoriasis   . Sleep apnea   . Tension headache     Patient Active Problem List   Diagnosis Date Noted  . Cervical high risk HPV (human papillomavirus) test positive 09/22/2014  . Psoriasis 09/20/2014  . Migraine 07/05/2011  . Tension headache   . Panic attacks     Past Surgical History:  Procedure Laterality Date  . CESAREAN SECTION  2007; 2009    Prior to Admission medications   Medication Sig Start Date End Date Taking? Authorizing Provider  acetaminophen (TYLENOL) 500 MG tablet Take 500 mg by mouth every 6 (six) hours as needed.    Historical Provider, MD  Adalimumab (HUMIRA PEN-CROHNS STARTER) 40 MG/0.8ML PNKT Inject 40 mg into the skin every 7 (seven) days.    Historical Provider, MD  albuterol (PROVENTIL HFA;VENTOLIN HFA) 108 (90 Base) MCG/ACT inhaler Inhale 2 puffs into the lungs every 6 (six) hours as needed for wheezing or shortness of breath. 02/08/16   Chinita Pester, FNP  amoxicillin (AMOXIL) 875 MG tablet Take 1 tablet (875 mg total)  by mouth 2 (two) times daily. 06/02/16   Tommi Rumps, PA-C  benzonatate (TESSALON PERLES) 100 MG capsule Take 1 capsule (100 mg total) by mouth every 6 (six) hours as needed for cough. 07/10/16   Rockne Menghini, MD  loperamide (IMODIUM A-D) 2 MG tablet Take 1 tablet (2 mg total) by mouth 4 (four) times daily as needed for diarrhea or loose stools. 07/10/16   Anne-Caroline Sharma Covert, MD  loratadine (CLARITIN) 10 MG tablet Take 1 tablet (10 mg total) by mouth daily. 12/26/15   Jami L Hagler, PA-C  magic mouthwash SOLN Take 5 mLs by mouth 4 (four) times daily as needed for mouth pain. Please mix equal amounts of each ingredient totaling 12/26/15   Jami L Hagler, PA-C  magic mouthwash w/lidocaine SOLN Take 5 mLs by mouth 4 (four) times daily. 06/02/16   Tommi Rumps, PA-C  ondansetron (ZOFRAN ODT) 4 MG disintegrating tablet Take 1 tablet (4 mg total) by mouth every 8 (eight) hours as needed for nausea or vomiting. 08/06/16   Emily Filbert, MD  prochlorperazine (COMPAZINE) 10 MG tablet Take 1 tablet (10 mg total) by mouth every 6 (six) hours as needed for nausea. 06/16/16   Irean Hong, MD  sertraline (ZOLOFT) 50 MG tablet Take 50 mg by mouth daily.    Historical Provider, MD  SUMAtriptan (IMITREX) 100 MG tablet Take 1 tablet (100 mg total) by mouth once as needed for  migraine. May repeat in 2 hours if headache persists or recurs. 08/06/16 08/07/17  Emily FilbertJonathan E Williams, MD  SUMAtriptan (IMITREX) 50 MG tablet Take 1 tablet (50 mg total) by mouth once as needed for migraine. May repeat in 2 hours if headache persists or recurs. 07/10/16 07/11/17  Rockne MenghiniAnne-Caroline Norman, MD    Allergies Patient has no known allergies.  Family History  Problem Relation Age of Onset  . Hypertension Father   . Coronary artery disease Father 6651    MI  . Arthritis Mother   . Migraines Mother   . Psoriasis Mother   . Breast cancer Paternal Aunt   . Cancer Paternal Aunt     breast  . Lung cancer Maternal  Grandfather   . Cancer Maternal Grandfather 60    lung, smoker  . Hypertension Maternal Uncle   . Diabetes    . Lung cancer Paternal Grandmother   . Cancer Paternal Grandmother 40    lung, smoker  . Mental illness Paternal Grandmother     schizophrenia  . Mental illness Paternal Uncle     bipolar  . Stroke Neg Hx     Social History Social History  Substance Use Topics  . Smoking status: Never Smoker  . Smokeless tobacco: Never Used  . Alcohol use No     Comment: Occasional    Review of Systems  Constitutional: Negative for fever. Cardiovascular: Negative for chest pain. Respiratory: Negative for shortness of breath. Gastrointestinal: Negative for abdominal pain, vomiting and diarrhea. Genitourinary: Negative for dysuria. Musculoskeletal: Negative for back pain. Skin: Negative for rash. Neurological: Positive for headache.  10-point ROS otherwise negative.  ____________________________________________   PHYSICAL EXAM:  VITAL SIGNS: ED Triage Vitals [09/15/16 1236]  Enc Vitals Group     BP 136/76     Pulse Rate 65     Resp 16     Temp 97.5 F (36.4 C)     Temp Source Oral     SpO2 98 %     Weight 195 lb (88.5 kg)     Height 5\' 4"  (1.626 m)     Head Circumference      Peak Flow      Pain Score 6    Constitutional: Alert and oriented. Well appearing and in no distress. Eyes: Conjunctivae are normal. Normal extraocular movements. ENT   Head: Normocephalic and atraumatic. Some tenderness over right frontal sinus.   Nose: No congestion/rhinnorhea.   Mouth/Throat: Mucous membranes are moist.   Neck: No stridor. Hematological/Lymphatic/Immunilogical: No cervical lymphadenopathy. Cardiovascular: Normal rate, regular rhythm.  No murmurs, rubs, or gallops.  Respiratory: Normal respiratory effort without tachypnea nor retractions. Breath sounds are clear and equal bilaterally. No wheezes/rales/rhonchi. Gastrointestinal: Soft and non tender. No  rebound. No guarding.  Genitourinary: Deferred Musculoskeletal: Normal range of motion in all extremities. No lower extremity edema. Neurologic:  Normal speech and language. No gross focal neurologic deficits are appreciated.  Skin:  Skin is warm, dry and intact. No rash noted. Psychiatric: Mood and affect are normal. Speech and behavior are normal. Patient exhibits appropriate insight and judgment.  ____________________________________________    LABS (pertinent positives/negatives)  Labs Reviewed - No data to display   ____________________________________________   EKG  None  ____________________________________________    RADIOLOGY  None   ____________________________________________   PROCEDURES  Procedures  ____________________________________________   INITIAL IMPRESSION / ASSESSMENT AND PLAN / ED COURSE  Pertinent labs & imaging results that were available during my care of the patient were reviewed  by me and considered in my medical decision making (see chart for details).  Patient presented to the emergency department today with primary concerns for sinusitis and recurrent migraines. At this point given that she has been having somewhat chronic headache for over 2 weeks will treat for potential bacterial sinusitis. Additionally patient requesting refill of her Imitrex.  ____________________________________________   FINAL CLINICAL IMPRESSION(S) / ED DIAGNOSES  Final diagnoses:  Acute frontal sinusitis, recurrence not specified     Note: This dictation was prepared with Dragon dictation. Any transcriptional errors that result from this process are unintentional     Phineas Semen, MD 09/15/16 1413

## 2016-09-19 ENCOUNTER — Other Ambulatory Visit
Admission: RE | Admit: 2016-09-19 | Discharge: 2016-09-19 | Disposition: A | Discharge status: Home / Self Care | Payer: Medicaid Other | Source: Ambulatory Visit | Attending: Pediatrics | Admitting: Pediatrics

## 2017-02-11 ENCOUNTER — Ambulatory Visit (INDEPENDENT_AMBULATORY_CARE_PROVIDER_SITE_OTHER): Payer: Medicaid Other | Admitting: Family Medicine

## 2017-02-11 ENCOUNTER — Encounter: Payer: Self-pay | Admitting: Family Medicine

## 2017-02-11 ENCOUNTER — Other Ambulatory Visit (HOSPITAL_COMMUNITY)
Admission: RE | Admit: 2017-02-11 | Discharge: 2017-02-11 | Disposition: A | Discharge status: Home / Self Care | Payer: Medicaid Other | Source: Ambulatory Visit | Attending: Family Medicine | Admitting: Family Medicine

## 2017-02-11 VITALS — BP 113/80 | HR 67 | Resp 18 | Ht 64.0 in | Wt 205.0 lb

## 2017-02-11 DIAGNOSIS — Z01419 Encounter for gynecological examination (general) (routine) without abnormal findings: Secondary | ICD-10-CM | POA: Diagnosis not present

## 2017-02-11 DIAGNOSIS — R87810 Cervical high risk human papillomavirus (HPV) DNA test positive: Secondary | ICD-10-CM | POA: Diagnosis present

## 2017-02-11 DIAGNOSIS — F1190 Opioid use, unspecified, uncomplicated: Secondary | ICD-10-CM

## 2017-02-11 DIAGNOSIS — Z0000 Encounter for general adult medical examination without abnormal findings: Secondary | ICD-10-CM | POA: Diagnosis not present

## 2017-02-11 DIAGNOSIS — R7989 Other specified abnormal findings of blood chemistry: Secondary | ICD-10-CM

## 2017-02-11 DIAGNOSIS — L409 Psoriasis, unspecified: Secondary | ICD-10-CM

## 2017-02-11 DIAGNOSIS — N912 Amenorrhea, unspecified: Secondary | ICD-10-CM | POA: Insufficient documentation

## 2017-02-11 MED ORDER — BUPRENORPHINE HCL-NALOXONE HCL 8-2 MG SL FILM: 1.0000 | ORAL_FILM | Freq: Every day | SUBLINGUAL | Status: AC

## 2017-02-11 NOTE — Patient Instructions (Signed)
Preventive Care 18-39 Years, Female Preventive care refers to lifestyle choices and visits with your health care provider that can promote health and wellness. What does preventive care include?  A yearly physical exam. This is also called an annual well check.  Dental exams once or twice a year.  Routine eye exams. Ask your health care provider how often you should have your eyes checked.  Personal lifestyle choices, including: ? Daily care of your teeth and gums. ? Regular physical activity. ? Eating a healthy diet. ? Avoiding tobacco and drug use. ? Limiting alcohol use. ? Practicing safe sex. ? Taking vitamin and mineral supplements as recommended by your health care provider. What happens during an annual well check? The services and screenings done by your health care provider during your annual well check will depend on your age, overall health, lifestyle risk factors, and family history of disease. Counseling Your health care provider may ask you questions about your:  Alcohol use.  Tobacco use.  Drug use.  Emotional well-being.  Home and relationship well-being.  Sexual activity.  Eating habits.  Work and work Statistician.  Method of birth control.  Menstrual cycle.  Pregnancy history.  Screening You may have the following tests or measurements:  Height, weight, and BMI.  Diabetes screening. This is done by checking your blood sugar (glucose) after you have not eaten for a while (fasting).  Blood pressure.  Lipid and cholesterol levels. These may be checked every 5 years starting at age 66.  Skin check.  Hepatitis C blood test.  Hepatitis B blood test.  Sexually transmitted disease (STD) testing.  BRCA-related cancer screening. This may be done if you have a family history of breast, ovarian, tubal, or peritoneal cancers.  Pelvic exam and Pap test. This may be done every 3 years starting at age 40. Starting at age 59, this may be done every 5  years if you have a Pap test in combination with an HPV test.  Discuss your test results, treatment options, and if necessary, the need for more tests with your health care provider. Vaccines Your health care provider may recommend certain vaccines, such as:  Influenza vaccine. This is recommended every year.  Tetanus, diphtheria, and acellular pertussis (Tdap, Td) vaccine. You may need a Td booster every 10 years.  Varicella vaccine. You may need this if you have not been vaccinated.  HPV vaccine. If you are 69 or younger, you may need three doses over 6 months.  Measles, mumps, and rubella (MMR) vaccine. You may need at least one dose of MMR. You may also need a second dose.  Pneumococcal 13-valent conjugate (PCV13) vaccine. You may need this if you have certain conditions and were not previously vaccinated.  Pneumococcal polysaccharide (PPSV23) vaccine. You may need one or two doses if you smoke cigarettes or if you have certain conditions.  Meningococcal vaccine. One dose is recommended if you are age 27-21 years and a first-year college student living in a residence hall, or if you have one of several medical conditions. You may also need additional booster doses.  Hepatitis A vaccine. You may need this if you have certain conditions or if you travel or work in places where you may be exposed to hepatitis A.  Hepatitis B vaccine. You may need this if you have certain conditions or if you travel or work in places where you may be exposed to hepatitis B.  Haemophilus influenzae type b (Hib) vaccine. You may need this if  you have certain risk factors.  Talk to your health care provider about which screenings and vaccines you need and how often you need them. This information is not intended to replace advice given to you by your health care provider. Make sure you discuss any questions you have with your health care provider. Document Released: 10/22/2001 Document Revised: 05/15/2016  Document Reviewed: 06/27/2015 Elsevier Interactive Patient Education  2017 [employer].

## 2017-02-11 NOTE — Assessment & Plan Note (Signed)
Needs repeat pap.

## 2017-02-11 NOTE — Assessment & Plan Note (Signed)
Check labs-if menopausal ok, if not--check pelvic sono--if elevated endometrial stripe-->needs EMB--discussed with patient.

## 2017-02-11 NOTE — Progress Notes (Signed)
Subjective:     Makayla Duke is a 41 y.o. female and is here for a comprehensive physical exam. The patient reports problems - amenorrhea. No cycle x 1 year. Under a lot of stress due to separation from her husband. 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  Social History   Social History  . Marital status: Single    Spouse name: N/A  . Number of children: 2  . Years of education: N/A   Occupational History  . Bartender/Waitress [employer]   Social History Main Topics  . Smoking status: Never Smoker  . Smokeless tobacco: Never Used  . Alcohol use No  . Drug use: No  . Sexual activity: Not Currently    Birth control/ protection: None   Other Topics Concern  . Not on file   Social History Narrative   Caffeine: occasional   Lives with 2 daughters   Occupation: bartender/server   Activity: no regular exercise   Diet: not enough water.  Good vegetables   Health Maintenance  Topic Date Due  . TETANUS/TDAP  05/09/1996  . INFLUENZA VACCINE  04/09/2017  . PAP SMEAR  09/20/2017  . HIV Screening  Completed    The following portions of the patient's history were reviewed and updated as appropriate: allergies, current medications, past family history, past medical history, past social history, past surgical history and problem list.  Review of Systems Pertinent items noted in HPI and remainder of comprehensive ROS otherwise negative.   Objective:    BP 113/80 (BP Location: Left Arm, Patient Position: Sitting, Cuff Size: Large)   Pulse 67   Resp 18   Ht 5\' 4"  (1.626 m)   Wt 205 lb (93 kg)   BMI 35.19 kg/m  General appearance: alert, cooperative, appears stated age and mildly obese Head: Normocephalic, without obvious abnormality, atraumatic Neck: no adenopathy, supple, symmetrical, trachea midline and thyroid not enlarged, symmetric, no tenderness/mass/nodules Lungs: clear to auscultation bilaterally Breasts: normal appearance, no masses or  tenderness Heart: regular rate and rhythm, S1, S2 normal, no murmur, click, rub or gallop Abdomen: soft, non-tender; bowel sounds normal; no masses,  no organomegaly Pelvic: cervix normal in appearance, external genitalia normal, no adnexal masses or tenderness, no cervical motion tenderness, uterus normal size, shape, and consistency, vagina normal without discharge and exquisitely tender on exam Extremities: extremities normal, atraumatic, no cyanosis or edema Pulses: 2+ and symmetric Skin: Skin color, texture, turgor normal. No rashes or lesions Lymph nodes: Cervical, supraclavicular, and axillary nodes normal. Neurologic: Grossly normal    Assessment:    Healthy female exam.      Plan:      Problem List Items Addressed This Visit      Unprioritized   Psoriasis   Relevant Medications   Secukinumab (COSENTYX 300 DOSE [place])   Cervical high risk HPV (human papillomavirus) test positive - Primary    Needs repeat pap      Relevant Orders   Cytology - PAP   Amenorrhea    Check labs-if menopausal ok, if not--check pelvic sono--if elevated endometrial stripe-->needs EMB--discussed with patient.      Relevant Orders   TSH   Follicle stimulating hormone   Prolactin    Other Visit Diagnoses    Encounter for gynecological examination without abnormal finding       Relevant Orders   Cytology - PAP   Lipid panel   CBC   Comprehensive metabolic panel   Chronic narcotic use  Relevant Medications   Buprenorphine HCl-Naloxone HCl 8-2 MG FILM      See After Visit Summary for Counseling Recommendations

## 2017-02-11 NOTE — Progress Notes (Signed)
Pap 09/2014 - Normal with Positive HRHPV Pt reports no menstrual cycle for the past year.

## 2017-02-12 LAB — COMPREHENSIVE METABOLIC PANEL WITH GFR
ALT: 32 [IU]/L (ref 0–32)
AST: 24 [IU]/L (ref 0–40)
Albumin/Globulin Ratio: 1.7 (ref 1.2–2.2)
Albumin: 4.7 g/dL (ref 3.5–5.5)
Alkaline Phosphatase: 76 [IU]/L (ref 39–117)
BUN/Creatinine Ratio: 21 (ref 9–23)
BUN: 15 mg/dL (ref 6–20)
Bilirubin Total: 0.9 mg/dL (ref 0.0–1.2)
CO2: 23 mmol/L (ref 18–29)
Calcium: 9.8 mg/dL (ref 8.7–10.2)
Chloride: 101 mmol/L (ref 96–106)
Creatinine, Ser: 0.72 mg/dL (ref 0.57–1.00)
GFR calc Af Amer: 122 mL/min/{1.73_m2}
GFR calc non Af Amer: 106 mL/min/{1.73_m2}
Globulin, Total: 2.7 g/dL (ref 1.5–4.5)
Glucose: 100 mg/dL — ABNORMAL HIGH (ref 65–99)
Potassium: 4.6 mmol/L (ref 3.5–5.2)
Sodium: 140 mmol/L (ref 134–144)
Total Protein: 7.4 g/dL (ref 6.0–8.5)

## 2017-02-12 LAB — LIPID PANEL
CHOL/HDL RATIO: 4 ratio (ref 0.0–4.4)
CHOLESTEROL TOTAL: 182 mg/dL (ref 100–199)
HDL: 45 mg/dL (ref >39)
LDL CALC: 122 mg/dL — AB (ref 0–99)
Triglycerides: 74 mg/dL (ref 0–149)
VLDL CHOLESTEROL CAL: 15 mg/dL (ref 5–40)

## 2017-02-12 LAB — TSH: TSH: 13.68 u[IU]/mL — ABNORMAL HIGH (ref 0.450–4.500)

## 2017-02-12 LAB — CBC
Hematocrit: 40.7 % (ref 34.0–46.6)
Hemoglobin: 13.6 g/dL (ref 11.1–15.9)
MCH: 27.1 pg (ref 26.6–33.0)
MCHC: 33.4 g/dL (ref 31.5–35.7)
MCV: 81 fL (ref 79–97)
Platelets: 210 10*3/uL (ref 150–379)
RBC: 5.02 x10E6/uL (ref 3.77–5.28)
RDW: 13.8 % (ref 12.3–15.4)
WBC: 6.5 10*3/uL (ref 3.4–10.8)

## 2017-02-12 LAB — FOLLICLE STIMULATING HORMONE: FSH: 2.6 m[IU]/mL

## 2017-02-12 LAB — PROLACTIN: Prolactin: 12 ng/mL (ref 4.8–23.3)

## 2017-02-12 NOTE — Progress Notes (Signed)
Called lab and added Free T3 and T4

## 2017-02-12 NOTE — Addendum Note (Signed)
Addended by: Gita KudoLASSITER, KRISTEN S on: 02/12/2017 02:14 PM   Modules accepted: Orders

## 2017-02-13 LAB — CYTOLOGY - PAP
DIAGNOSIS: NEGATIVE
HPV: NOT DETECTED

## 2017-02-14 ENCOUNTER — Telehealth: Payer: Self-pay | Admitting: *Deleted

## 2017-02-14 DIAGNOSIS — N912 Amenorrhea, unspecified: Secondary | ICD-10-CM

## 2017-02-14 LAB — SPECIMEN STATUS REPORT

## 2017-02-14 LAB — T4, FREE: Free T4: 0.9 ng/dL (ref 0.82–1.77)

## 2017-02-14 LAB — T3, FREE: T3, Free: 3.3 pg/mL (ref 2.0–4.4)

## 2017-02-14 NOTE — Telephone Encounter (Signed)
-----   Message from Reva Boresanya S Pratt, MD sent at 02/14/2017  8:47 AM EDT ----- Her TSH is high but her Free T3 and T4 are normal--repeat in 6 wks. Check pelvic sono due to no cycle in > 1 year and not menopausal.

## 2017-02-14 NOTE — Telephone Encounter (Signed)
Informed pt or results and recommendation.  Scheduled follow-up lab on 03-24-17 and order placed for US. Informed pt that someone would be in touch with her the first of next week with the US date and time.

## 2017-02-17 ENCOUNTER — Telehealth: Payer: Self-pay | Admitting: Radiology

## 2017-02-17 NOTE — Telephone Encounter (Signed)
Left voicemail on cell phone, need dates and times of when [place] is [place]

## 2017-02-20 ENCOUNTER — Ambulatory Visit
Admission: RE | Admit: 2017-02-20 | Discharge: 2017-02-20 | Disposition: A | Discharge status: Home / Self Care | Payer: Medicaid Other | Source: Ambulatory Visit | Attending: Family Medicine | Admitting: Family Medicine

## 2017-02-20 DIAGNOSIS — N912 Amenorrhea, unspecified: Secondary | ICD-10-CM | POA: Diagnosis not present

## 2017-02-21 ENCOUNTER — Telehealth: Payer: Self-pay | Admitting: *Deleted

## 2017-02-21 NOTE — Telephone Encounter (Signed)
Called pt, no answer, left message regarding recommendations and instructed to call back.

## 2017-02-21 NOTE — Telephone Encounter (Signed)
-----   Message from Reva Boresanya S Pratt, MD sent at 02/19/2017 11:10 AM EDT ----- Not really, did we check her vitamin D. Would recommend sleep study... ----- Message ----- From: Gita KudoLassiter, Winfrey Chillemi S, RN Sent: 02/18/2017   2:54 PM To: Reva Boresanya S Pratt, MD  Hey,   Pt called c/o extreme tiredness to the point where she falls asleep sitting up and even feels that way when driving.  Wanted to know if her thyroid levels could be the reason.  Informed pt that the T3 and T4 were normal but the TSH was elevated which could indicate an under active thyroid and that fatigue is common but no usually to that extent. Informed pt she may need a sleep study.  She stated she has a history of sleep apnea and didn't seem to fond of doing the sleep study.  Is there any other suggestions to evaluate her for the extreme tiredness?

## 2017-02-24 ENCOUNTER — Telehealth: Payer: Self-pay | Admitting: *Deleted

## 2017-02-24 DIAGNOSIS — N912 Amenorrhea, unspecified: Secondary | ICD-10-CM

## 2017-02-24 MED ORDER — MEDROXYPROGESTERONE ACETATE 10 MG PO TABS: 10.0000 mg | ORAL_TABLET | Freq: Every day | ORAL | 2 refills | Status: AC

## 2017-02-24 NOTE — Telephone Encounter (Signed)
-----   Message from Reva Boresanya S Pratt, MD sent at 02/24/2017 11:01 AM EDT ----- I did not see she was on MyChart or I would have messaged her--since her u/s is normal, will give her a provera challenge 10 mg x 10 days and see if this starts her cycle. Needs f/u visit after 2-3 months to see if her cycles have gotten regular yet. ----- Message ----- From: Arne ClevelandHutchinson, Zenith Lamphier J, CMA Sent: 02/24/2017   9:17 AM To: Reva Boresanya S Pratt, MD  Did you send her a message in MyChart? If not, I will call her to let her know.   If her US was abnormal we were going to do an EMB so what is the treatment plan since her US was normal?    Thanks MH  ----- Message ----- From: Reva BoresPratt, Tanya S, MD Sent: 02/24/2017   8:41 AM To: Lesle Chriswh [place] [facility]  Her u/s is normal

## 2017-03-24 ENCOUNTER — Other Ambulatory Visit: Payer: Medicaid Other

## 2017-03-26 ENCOUNTER — Other Ambulatory Visit: Payer: Medicaid Other

## 2017-07-09 ENCOUNTER — Encounter: Payer: Self-pay | Admitting: Emergency Medicine

## 2017-07-09 ENCOUNTER — Emergency Department
Admission: EM | Admit: 2017-07-09 | Discharge: 2017-07-09 | Disposition: A | Discharge status: Home / Self Care | Payer: Medicaid Other | Attending: Emergency Medicine | Admitting: Emergency Medicine

## 2017-07-09 DIAGNOSIS — L409 Psoriasis, unspecified: Secondary | ICD-10-CM | POA: Diagnosis not present

## 2017-07-09 MED ORDER — TRIAMCINOLONE ACETONIDE 0.025 % EX OINT: 1.0000 "application " | TOPICAL_OINTMENT | Freq: Two times a day (BID) | CUTANEOUS | 0 refills | Status: AC

## 2017-07-09 MED ORDER — CEPHALEXIN 500 MG PO CAPS: 500.0000 mg | ORAL_CAPSULE | Freq: Three times a day (TID) | ORAL | 0 refills | Status: AC

## 2017-07-09 NOTE — ED Triage Notes (Signed)
Pt has psoriasis but has not taken meds in a long time. Noted crack on skin to right thumb over 2 weeks ago and now feels like it is infected. Some redness and swelling noted over right thumb.

## 2017-07-09 NOTE — ED Provider Notes (Signed)
[facility] Emergency Department Provider Note  ____________________________________________  Time seen: Approximately 11:17 PM  I have reviewed the triage vital signs and the nursing notes.   HISTORY  Chief Complaint Wound Infection    HPI Makayla Duke is a 41 y.o. female presents to the emergency department with an exacerbation of psoriasis. Patient reports that she has been non-compliant with her daily psoriasis therapy prescribed by dermatology. Patient has noticed skin fissures with dried serosanguinous exudate and is concerned that she has a "staph infection". Patient's complaints are limited to right thumb. No new alleviating measures have been attempted.    Past Medical History:  Diagnosis Date  . Cervicalgia    ([place])  . Childhood asthma   . Elevated blood pressure reading without diagnosis of hypertension   . Migraines   . Panic attacks   . Psoriasis   . Sleep apnea   . Tension headache     Patient Active Problem List   Diagnosis Date Noted  . Amenorrhea 02/11/2017  . Cervical high risk HPV (human papillomavirus) test positive 09/22/2014  . Psoriasis 09/20/2014  . Migraine 07/05/2011  . Tension headache   . Panic attacks     Past Surgical History:  Procedure Laterality Date  . CESAREAN SECTION  2007; 2009    Prior to Admission medications   Medication Sig Start Date End Date Taking? Authorizing Provider  albuterol (PROVENTIL HFA;VENTOLIN HFA) 108 (90 Base) MCG/ACT inhaler Inhale 2 puffs into the lungs every 6 (six) hours as needed for wheezing or shortness of breath. Patient not taking: Reported on 02/11/2017 02/08/16   Kem Boroughs B, FNP  Buprenorphine HCl-Naloxone HCl 8-2 MG FILM Place 1 Film under the tongue daily. 02/11/17   Reva Bores, MD  cephALEXin (KEFLEX) 500 MG capsule Take 1 capsule (500 mg total) by mouth 3 (three) times daily. 07/09/17 07/19/17  Orvil Feil, PA-C  medroxyPROGESTERone (PROVERA) 10 MG  tablet Take 1 tablet (10 mg total) by mouth daily. Use for ten days 02/24/17   Ewa Gentry Bing, MD  Secukinumab (COSENTYX 300 DOSE [place]) Inject 300 mg into the skin every 14 (fourteen) days.    [provider]  SUMAtriptan (IMITREX) 100 MG tablet Take 1 tablet (100 mg total) by mouth once as needed for migraine. May repeat in 2 hours if headache persists or recurs. 08/06/16 08/07/17  Emily Filbert, MD  triamcinolone (KENALOG) 0.025 % ointment Apply 1 application topically 2 (two) times daily. 07/09/17   Orvil Feil, PA-C    Allergies Patient has no known allergies.  Family History  Problem Relation Age of Onset  . Hypertension Father   . Coronary artery disease Father 64       MI  . Arthritis Mother   . Migraines Mother   . Psoriasis Mother   . Breast cancer Paternal Aunt   . Cancer Paternal Aunt 40       breast  . Lung cancer Maternal Grandfather   . Cancer Maternal Grandfather 60       lung, smoker  . Hypertension Maternal Uncle   . Diabetes Unknown   . Lung cancer Paternal Grandmother   . Cancer Paternal Grandmother 40       lung, smoker  . Mental illness Paternal Grandmother        schizophrenia  . Mental illness Paternal Uncle        bipolar  . Stroke Neg Hx     Social History Social History  Substance  Use Topics  . Smoking status: Never Smoker  . Smokeless tobacco: Never Used  . Alcohol use No     Review of Systems  Constitutional: No fever/chills Eyes: No visual changes. No discharge ENT: No upper respiratory complaints. Cardiovascular: no chest pain. Respiratory: no cough. No SOB. Musculoskeletal: Negative for musculoskeletal pain. Skin: Patient has psoriasis.  Neurological: Negative for headaches, focal weakness or numbness.  ____________________________________________   PHYSICAL EXAM:  VITAL SIGNS: ED Triage Vitals [07/09/17 2243]  Enc Vitals Group     BP (!) 145/82     Pulse Rate 98     Resp 18     Temp 98.1 F (36.7 C)      Temp Source Oral     SpO2 99 %     Weight 202 lb (91.6 kg)     Height 5\' 4"  (1.626 m)     Head Circumference      Peak Flow      Pain Score 8     Pain Loc      Pain Edu?      Excl. in GC?      Constitutional: Alert and oriented. Well appearing and in no acute distress. Eyes: Conjunctivae are normal. PERRL. EOMI. Head: Atraumatic. Cardiovascular: Normal rate, regular rhythm. Normal S1 and S2.  Good peripheral circulation. Respiratory: Normal respiratory effort without tachypnea or retractions. Lungs CTAB. Good air entry to the bases with no decreased or absent breath sounds. Musculoskeletal: Full range of motion to all extremities. No gross deformities appreciated. Neurologic:  Normal speech and language. No gross focal neurologic deficits are appreciated.  Skin: Patient has psoriasis of the right thumb with associated skin fissure and dried serosanguinous exudate. No surrounding cellulitis.  Psychiatric: Mood and affect are normal. Speech and behavior are normal. Patient exhibits appropriate insight and judgement.   ____________________________________________   LABS (all labs ordered are listed, but only abnormal results are displayed)  Labs Reviewed - No data to display ____________________________________________  EKG   ____________________________________________  RADIOLOGY   No results found.  ____________________________________________    PROCEDURES  Procedure(s) performed:    Procedures    Medications - No data to display   ____________________________________________   INITIAL IMPRESSION / ASSESSMENT AND PLAN / ED COURSE  Pertinent labs & imaging results that were available during my care of the patient were reviewed by me and considered in my medical decision making (see chart for details).  Review of the [place] CSRS was performed in accordance of the [facility] prior to dispensing any controlled drugs.    Assessment and Plan: Psoriasis:   Patient presents to the emergency department with an exacerbation of psoriasis. Patient was advised to use heavy moisturizers and triamcinolone cream on affected areas until she can follow up with dermatology. Due to the extent of patient's concern for staph infection, Keflex was prescribed at discharge with explicit instructions to only start medication if honey colored crusts spread and become numerous. Patient voiced understanding regarding this recommendation. Vital signs were reassuring prior to discharge. All patient questions were answered.      ____________________________________________  FINAL CLINICAL IMPRESSION(S) / ED DIAGNOSES  Final diagnoses:  Psoriasis      NEW MEDICATIONS STARTED DURING THIS VISIT:  New Prescriptions   CEPHALEXIN (KEFLEX) 500 MG CAPSULE    Take 1 capsule (500 mg total) by mouth 3 (three) times daily.   TRIAMCINOLONE (KENALOG) 0.025 % OINTMENT    Apply 1 application topically 2 (two) times daily.  This chart was dictated using voice recognition software/Dragon. Despite best efforts to proofread, errors can occur which can change the meaning. Any change was purely unintentional.    Orvil FeilWoods, Paiden Cavell M, PA-C 07/09/17 2324    Dionne BucySiadecki, Sebastian, MD 07/09/17 2337

## 2017-11-03 ENCOUNTER — Other Ambulatory Visit: Payer: Self-pay

## 2017-11-03 ENCOUNTER — Encounter: Payer: Self-pay | Admitting: Emergency Medicine

## 2017-11-03 ENCOUNTER — Emergency Department
Admission: EM | Admit: 2017-11-03 | Discharge: 2017-11-03 | Disposition: A | Discharge status: Home / Self Care | Payer: Medicaid Other | Attending: Emergency Medicine | Admitting: Emergency Medicine

## 2017-11-03 DIAGNOSIS — J45909 Unspecified asthma, uncomplicated: Secondary | ICD-10-CM | POA: Diagnosis not present

## 2017-11-03 DIAGNOSIS — M549 Dorsalgia, unspecified: Secondary | ICD-10-CM | POA: Insufficient documentation

## 2017-11-03 DIAGNOSIS — R6883 Chills (without fever): Secondary | ICD-10-CM | POA: Insufficient documentation

## 2017-11-03 DIAGNOSIS — Z79899 Other long term (current) drug therapy: Secondary | ICD-10-CM | POA: Diagnosis not present

## 2017-11-03 LAB — URINALYSIS, COMPLETE (UACMP) WITH MICROSCOPIC
BACTERIA UA: NONE SEEN
Bilirubin Urine: NEGATIVE
GLUCOSE, UA: NEGATIVE mg/dL
Hgb urine dipstick: NEGATIVE
Ketones, ur: NEGATIVE mg/dL
Leukocytes, UA: NEGATIVE
NITRITE: NEGATIVE
PROTEIN: NEGATIVE mg/dL
SPECIFIC GRAVITY, URINE: 1.023 (ref 1.005–1.030)
pH: 6 (ref 5.0–8.0)

## 2017-11-03 MED ORDER — CYCLOBENZAPRINE HCL 10 MG PO TABS
5.0000 mg | ORAL_TABLET | Freq: Once | ORAL | Status: AC
  Administered 2017-11-03: 5 mg via ORAL
  Filled 2017-11-03: qty 1

## 2017-11-03 MED ORDER — OXYCODONE HCL 5 MG PO TABS
5.0000 mg | ORAL_TABLET | Freq: Once | ORAL | Status: AC
  Administered 2017-11-03: 5 mg via ORAL
  Filled 2017-11-03: qty 1

## 2017-11-03 MED ORDER — LIDOCAINE 5 % EX PTCH: 1.0000 | MEDICATED_PATCH | Freq: Two times a day (BID) | CUTANEOUS | 0 refills | Status: DC

## 2017-11-03 MED ORDER — CYCLOBENZAPRINE HCL 5 MG PO TABS: ORAL_TABLET | ORAL | 0 refills | Status: DC

## 2017-11-03 MED ORDER — NAPROXEN 500 MG PO TABS: 500.0000 mg | ORAL_TABLET | Freq: Two times a day (BID) | ORAL | 0 refills | Status: AC

## 2017-11-03 NOTE — ED Provider Notes (Signed)
[facility] Emergency Department Provider Note  ____________________________________________  Time seen: Approximately 1:47 PM  I have reviewed the triage vital signs and the nursing notes.   HISTORY  Chief Complaint Back Pain    HPI Ezella Kell is a 42 y.o. female that presents to the emergency department for evaluation of mid back pain since yesterday.  She was at a church function when pain started.  She has had chills but no fever. No trauma. She has had UTIs in the past and is concerned that this is the same.  She has taken several ibuprofens and tylenols with little relief.  No bowel or bladder dysfunction or saddle paresthesias.  No nausea, vomiting, abdominal pain dysuria, frequency, urgency.      Past Medical History:  Diagnosis Date  . Cervicalgia    ([place])  . Childhood asthma   . Elevated blood pressure reading without diagnosis of hypertension   . Migraines   . Panic attacks   . Psoriasis   . Sleep apnea   . Tension headache     Patient Active Problem List   Diagnosis Date Noted  . Amenorrhea 02/11/2017  . Cervical high risk HPV (human papillomavirus) test positive 09/22/2014  . Psoriasis 09/20/2014  . Migraine 07/05/2011  . Tension headache   . Panic attacks     Past Surgical History:  Procedure Laterality Date  . CESAREAN SECTION  2007; 2009    Prior to Admission medications   Medication Sig Start Date End Date Taking? Authorizing Provider  albuterol (PROVENTIL HFA;VENTOLIN HFA) 108 (90 Base) MCG/ACT inhaler Inhale 2 puffs into the lungs every 6 (six) hours as needed for wheezing or shortness of breath. Patient not taking: Reported on 02/11/2017 02/08/16   Kem Boroughs B, FNP  Buprenorphine HCl-Naloxone HCl 8-2 MG FILM Place 1 Film under the tongue daily. 02/11/17   Reva Bores, MD  cyclobenzaprine (FLEXERIL) 5 MG tablet Take 1-2 tablets 3 times daily as needed 11/03/17   Enid Derry, PA-C  lidocaine (LIDODERM) 5 %  Place 1 patch onto the skin every 12 (twelve) hours. Remove & Discard patch within 12 hours or as directed by MD 11/03/17 11/03/18  Enid Derry, PA-C  medroxyPROGESTERone (PROVERA) 10 MG tablet Take 1 tablet (10 mg total) by mouth daily. Use for ten days 02/24/17   Lock[place] Bing, MD  naproxen (NAPROSYN) 500 MG tablet Take 1 tablet (500 mg total) by mouth 2 (two) times daily with a meal. 11/03/17 11/03/18  Enid Derry, PA-C  Secukinumab (COSENTYX 300 DOSE [place]) Inject 300 mg into the skin every 14 (fourteen) days.    [provider]  SUMAtriptan (IMITREX) 100 MG tablet Take 1 tablet (100 mg total) by mouth once as needed for migraine. May repeat in 2 hours if headache persists or recurs. 08/06/16 08/07/17  Emily Filbert, MD  triamcinolone (KENALOG) 0.025 % ointment Apply 1 application topically 2 (two) times daily. 07/09/17   Orvil Feil, PA-C    Allergies Patient has no known allergies.  Family History  Problem Relation Age of Onset  . Hypertension Father   . Coronary artery disease Father 16       MI  . Arthritis Mother   . Migraines Mother   . Psoriasis Mother   . Breast cancer Paternal Aunt   . Cancer Paternal Aunt 40       breast  . Lung cancer Maternal Grandfather   . Cancer Maternal Grandfather 39  lung, smoker  . Hypertension Maternal Uncle   . Diabetes Unknown   . Lung cancer Paternal Grandmother   . Cancer Paternal Grandmother 40       lung, smoker  . Mental illness Paternal Grandmother        schizophrenia  . Mental illness Paternal Uncle        bipolar  . Stroke Neg Hx     Social History Social History   Tobacco Use  . Smoking status: Never Smoker  . Smokeless tobacco: Never Used  Substance Use Topics  . Alcohol use: No    Alcohol/week: 0.0 oz  . Drug use: No     Review of Systems  Constitutional: No fever Cardiovascular: No chest pain. Respiratory: No SOB. Gastrointestinal: No abdominal pain.  No nausea, no vomiting.   Musculoskeletal: Positive for back pain. Skin: Negative for rash, abrasions, lacerations, ecchymosis.   ____________________________________________   PHYSICAL EXAM:  VITAL SIGNS: ED Triage Vitals  Enc Vitals Group     BP 11/03/17 1133 (!) 146/94     Pulse Rate 11/03/17 1133 73     Resp 11/03/17 1133 20     Temp 11/03/17 1133 98.6 F (37 C)     Temp Source 11/03/17 1133 Oral     SpO2 11/03/17 1133 100 %     Weight 11/03/17 1134 185 lb (83.9 kg)     Height 11/03/17 1134 5\' 4"  (1.626 m)     Head Circumference --      Peak Flow --      Pain Score 11/03/17 1134 8     Pain Loc --      Pain Edu? --      Excl. in GC? --      Constitutional: Alert and oriented. Well appearing and in no acute distress. Eyes: Conjunctivae are normal. PERRL. EOMI. Head: Atraumatic. ENT:      Ears:      Nose: No congestion/rhinnorhea.      Mouth/Throat: Mucous membranes are moist.  Neck: No stridor.  Cardiovascular: Normal rate, regular rhythm.  Good peripheral circulation. Respiratory: Normal respiratory effort without tachypnea or retractions. Lungs CTAB. Good air entry to the bases with no decreased or absent breath sounds. Gastrointestinal: Abdomen soft and tender to palpation. No distention.  Musculoskeletal: Full range of motion to all extremities. No gross deformities appreciated. Tenderness to palpation over right thoracic thoracic muscles. Full ROM of back. Strength 5/5 in lower extremities. Neurologic:  Normal speech and language. No gross focal neurologic deficits are appreciated.  Skin:  Skin is warm, dry and intact. No rash noted.   ____________________________________________   LABS (all labs ordered are listed, but only abnormal results are displayed)  Labs Reviewed  URINALYSIS, COMPLETE (UACMP) WITH MICROSCOPIC - Abnormal; Notable for the following components:      Result Value   Color, Urine YELLOW (*)    APPearance CLEAR (*)    Squamous Epithelial / LPF 0-5 (*)    All  other components within normal limits  POC URINE PREG, ED   ____________________________________________  EKG   ____________________________________________  RADIOLOGY  No results found.  ____________________________________________    PROCEDURES  Procedure(s) performed:    Procedures    Medications  oxyCODONE (Oxy IR/ROXICODONE) immediate release tablet 5 mg (5 mg Oral Given 11/03/17 1424)  cyclobenzaprine (FLEXERIL) tablet 5 mg (5 mg Oral Given 11/03/17 1424)     ____________________________________________   INITIAL IMPRESSION / ASSESSMENT AND PLAN / ED COURSE  Pertinent labs & imaging  results that were available during my care of the patient were reviewed by me and considered in my medical decision making (see chart for details).  Review of the [place] CSRS was performed in accordance of the [facility] prior to dispensing any controlled drugs.     Patient presented to the emergency department for evaluation of mid back pain for 2 days.  Vital signs and exam are reassuring.  No infection on urinalysis.  This is likely musculoskeletal.  Patient was given oxycodone and Flexeril since she has taken both Tylenol and ibuprofen today.  Patient will be discharged home with prescriptions for Flexeril, naproxen, and Lidoderm. Patient is to follow up with PCP as directed. Patient is given ED precautions to return to the ED for any worsening or new symptoms.     ____________________________________________  FINAL CLINICAL IMPRESSION(S) / ED DIAGNOSES  Final diagnoses:  Mid back pain      NEW MEDICATIONS STARTED DURING THIS VISIT:  ED Discharge Orders        Ordered    naproxen (NAPROSYN) 500 MG tablet  2 times daily with meals     11/03/17 1451    lidocaine (LIDODERM) 5 %  Every 12 hours     11/03/17 1451    cyclobenzaprine (FLEXERIL) 5 MG tablet     11/03/17 1451          This chart was dictated using voice recognition software/Dragon. Despite best efforts to  proofread, errors can occur which can change the meaning. Any change was purely unintentional.    Enid DerryWagner, Eileen Croswell, PA-C 11/03/17 1808    Rockne MenghiniNorman, Anne-Caroline, MD 11/04/17 1031

## 2017-11-03 NOTE — ED Triage Notes (Signed)
Low back pain since yesterday. Denies fall or injury.

## 2017-11-10 ENCOUNTER — Emergency Department
Admission: EM | Admit: 2017-11-10 | Discharge: 2017-11-10 | Disposition: A | Discharge status: Home / Self Care | Payer: Medicaid Other | Attending: Emergency Medicine | Admitting: Emergency Medicine

## 2017-11-10 ENCOUNTER — Other Ambulatory Visit: Payer: Self-pay

## 2017-11-10 DIAGNOSIS — J039 Acute tonsillitis, unspecified: Secondary | ICD-10-CM

## 2017-11-10 DIAGNOSIS — J45909 Unspecified asthma, uncomplicated: Secondary | ICD-10-CM | POA: Insufficient documentation

## 2017-11-10 DIAGNOSIS — Z79899 Other long term (current) drug therapy: Secondary | ICD-10-CM | POA: Diagnosis not present

## 2017-11-10 DIAGNOSIS — J029 Acute pharyngitis, unspecified: Secondary | ICD-10-CM | POA: Diagnosis present

## 2017-11-10 LAB — GROUP A STREP BY PCR: GROUP A STREP BY PCR: NOT DETECTED

## 2017-11-10 MED ORDER — AMOXICILLIN 875 MG PO TABS: 875.0000 mg | ORAL_TABLET | Freq: Two times a day (BID) | ORAL | 0 refills | Status: AC

## 2017-11-10 NOTE — ED Notes (Signed)
See triage note  States she developed sore throat last weds.Subjective   fever   Then developed a rash to face and hands couple of days ago  Afebrile on arrival  States throat feels swollen

## 2017-11-10 NOTE — ED Triage Notes (Signed)
Pt states has a sore throat with "pus" and rash to nose and bilateral hands. Pt states sore throat began last Wednesday and rash began approx one day after. Pt states she has had a fever and chills at home.

## 2017-11-10 NOTE — Discharge Instructions (Signed)
Follow-up with your primary care doctor if any continued problems in the next 3-4 days.  Begin taking amoxicillin 875 mg twice daily for the next 10 days.  Tylenol or ibuprofen as needed.  You may also use salt water gargles for your throat as needed for comfort.

## 2017-11-10 NOTE — ED Provider Notes (Signed)
[facility] Emergency Department Provider Note  ____________________________________________   First MD Initiated Contact with Patient 11/10/17 [phone]     (approximate)  I have reviewed the triage vital signs and the nursing notes.   HISTORY  Chief Complaint Sore Throat and Rash  HPI Makayla Duke is a 42 y.o. female is here with complaint of sore throat with pus.  She also complains of sores in her mouth.  Patient also complains of a rash to her nose and bilateral hands.  Patient is unsure if she has been exposed to strep throat.  She rates her pain as an 8 out of 10.   Past Medical History:  Diagnosis Date  . Cervicalgia    ([place])  . Childhood asthma   . Elevated blood pressure reading without diagnosis of hypertension   . Migraines   . Panic attacks   . Psoriasis   . Sleep apnea   . Tension headache     Patient Active Problem List   Diagnosis Date Noted  . Amenorrhea 02/11/2017  . Cervical high risk HPV (human papillomavirus) test positive 09/22/2014  . Psoriasis 09/20/2014  . Migraine 07/05/2011  . Tension headache   . Panic attacks     Past Surgical History:  Procedure Laterality Date  . CESAREAN SECTION  2007; 2009    Prior to Admission medications   Medication Sig Start Date End Date Taking? Authorizing Provider  amoxicillin (AMOXIL) 875 MG tablet Take 1 tablet (875 mg total) by mouth 2 (two) times daily. 11/10/17   Tommi Rumps, PA-C  Buprenorphine HCl-Naloxone HCl 8-2 MG FILM Place 1 Film under the tongue daily. 02/11/17   Reva Bores, MD  medroxyPROGESTERone (PROVERA) 10 MG tablet Take 1 tablet (10 mg total) by mouth daily. Use for ten days 02/24/17   [place] Bing, MD  naproxen (NAPROSYN) 500 MG tablet Take 1 tablet (500 mg total) by mouth 2 (two) times daily with a meal. 11/03/17 11/03/18  Enid Derry, PA-C  Secukinumab (COSENTYX 300 DOSE ) Inject 300 mg into the skin every 14 (fourteen) days.    [provider]  SUMAtriptan (IMITREX) 100 MG tablet Take 1 tablet (100 mg total) by mouth once as needed for migraine. May repeat in 2 hours if headache persists or recurs. 08/06/16 08/07/17  Emily Filbert, MD  triamcinolone (KENALOG) 0.025 % ointment Apply 1 application topically 2 (two) times daily. 07/09/17   Orvil Feil, PA-C    Allergies Patient has no known allergies.  Family History  Problem Relation Age of Onset  . Hypertension Father   . Coronary artery disease Father 70       MI  . Arthritis Mother   . Migraines Mother   . Psoriasis Mother   . Breast cancer Paternal Aunt   . Cancer Paternal Aunt 40       breast  . Lung cancer Maternal Grandfather   . Cancer Maternal Grandfather 60       lung, smoker  . Hypertension Maternal Uncle   . Diabetes Unknown   . Lung cancer Paternal Grandmother   . Cancer Paternal Grandmother 40       lung, smoker  . Mental illness Paternal Grandmother        schizophrenia  . Mental illness Paternal Uncle        bipolar  . Stroke Neg Hx     Social History Social History   Tobacco Use  . Smoking status: Never Smoker  .  Smokeless tobacco: Never Used  Substance Use Topics  . Alcohol use: No    Alcohol/week: 0.0 oz  . Drug use: No    Review of Systems Constitutional: Positive fever/chills Eyes: No visual changes. ENT: Positive sore throat. Cardiovascular: Denies chest pain. Respiratory: Denies shortness of breath. Gastrointestinal: No abdominal pain.  No nausea, no vomiting.  Musculoskeletal: Negative for back pain. Skin: Positive for rash. Neurological: Negative for headaches, focal weakness or numbness. ____________________________________________   PHYSICAL EXAM:  VITAL SIGNS: ED Triage Vitals [11/10/17 0612]  Enc Vitals Group     BP 134/88     Pulse Rate 89     Resp 16     Temp 98.6 F (37 C)     Temp Source Oral     SpO2 100 %     Weight 195 lb (88.5 kg)     Height 5\' 4"  (1.626 m)     Head  Circumference      Peak Flow      Pain Score 8     Pain Loc      Pain Edu?      Excl. in GC?    Constitutional: Alert and oriented. Well appearing and in no acute distress. Eyes: Conjunctivae are normal.  Head: Atraumatic. Nose: No congestion/rhinnorhea. Mouth/Throat: Mucous membranes are moist.  Oropharynx slightly erythematous with exudate noted posteriorly.  Uvula is midline and patient is maintaining secretions without any difficulty. Neck: No stridor.  Hematological/Lymphatic/Immunilogical: No cervical lymphadenopathy. Cardiovascular: Normal rate, regular rhythm. Grossly normal heart sounds.  Good peripheral circulation. Respiratory: Normal respiratory effort.  No retractions. Lungs CTAB. Musculoskeletal: Moves upper and lower extremities without any difficulty and normal gait was noted. Neurologic:  Normal speech and language. No gross focal neurologic deficits are appreciated.  Skin:  Skin is warm, dry and intact.  Patient has a diffuse erythematous rash which is very dry and scaly to touch.  Patient states she does have a history of psoriasis but this is not usually where she breaks out.  Area is nontender to touch and no erythema or drainage is noted.  Areas are isolated to one area on the face and dorsal aspect of bilateral hands. Psychiatric: Mood and affect are normal. Speech and behavior are normal.  ____________________________________________   LABS (all labs ordered are listed, but only abnormal results are displayed)  Labs Reviewed  GROUP A STREP BY PCR     PROCEDURES  Procedure(s) performed: None  Procedures  Critical Care performed: No  ____________________________________________   INITIAL IMPRESSION / ASSESSMENT AND PLAN / ED COURSE Patient had a strep test that was ordered while in triage.  This was reported negative.  Patient was made aware that her test was negative however we are going to be treating her for tonsillitis.  Patient was placed on  amoxicillin 875 twice daily for 10 days.  Patient is also to take Tylenol or ibuprofen as needed for throat pain.  She is to follow-up with her PCP at [facility] if any continued problems and also for reevaluation of her rash.  ____________________________________________   FINAL CLINICAL IMPRESSION(S) / ED DIAGNOSES  Final diagnoses:  Tonsillitis     ED Discharge Orders        Ordered    amoxicillin (AMOXIL) 875 MG tablet  2 times daily     11/10/17 0739       Note:  This document was prepared using Dragon voice recognition software and may include unintentional dictation errors.  Tommi Rumps, PA-C 11/10/17 1521    Jeanmarie Plant, MD 11/10/17 [phone]

## 2017-12-10 ENCOUNTER — Encounter: Payer: Self-pay | Admitting: Radiology

## 2019-01-18 IMAGING — US US PELVIS COMPLETE
1 series · 14 of 25 positions shown · non-contrast
Comparison: None

CLINICAL DATA: Amenorrhea x1 year

EXAM:
TRANSABDOMINAL AND TRANSVAGINAL ULTRASOUND OF PELVIS
TECHNIQUE: Both transabdominal and transvaginal ultrasound examinations of the
pelvis were performed. Transabdominal technique was performed for
global imaging of the pelvis including uterus, ovaries, adnexal
regions, and pelvic cul-de-sac. It was necessary to proceed with
endovaginal exam following the transabdominal exam to visualize the
endometrium and bilateral ovaries.

[Series 1: us pelvis complete · 0.21mm/px · 14 of 91 slices shown]
[im 1/91]
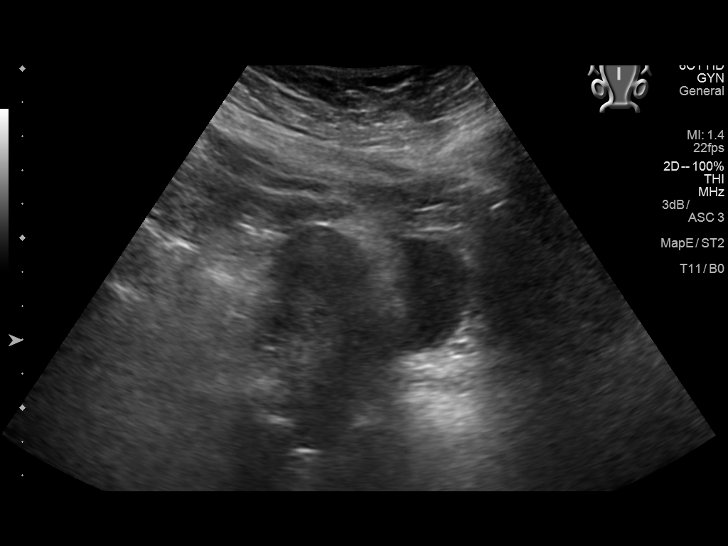
[im 8/91]
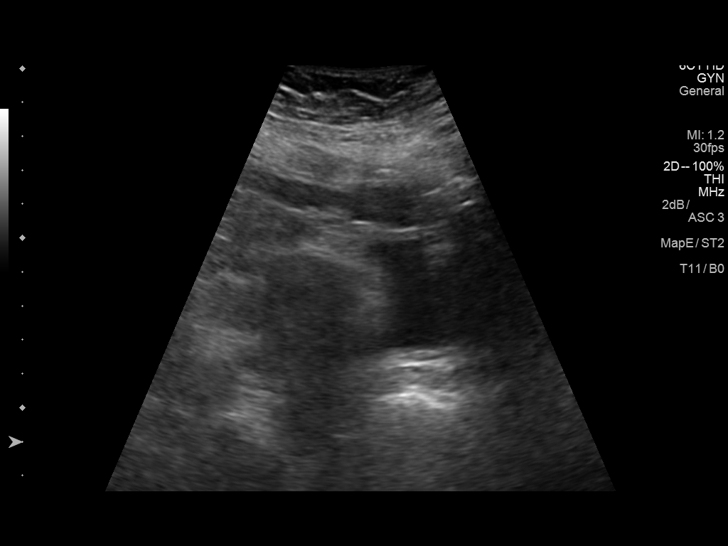
[im 16/91]
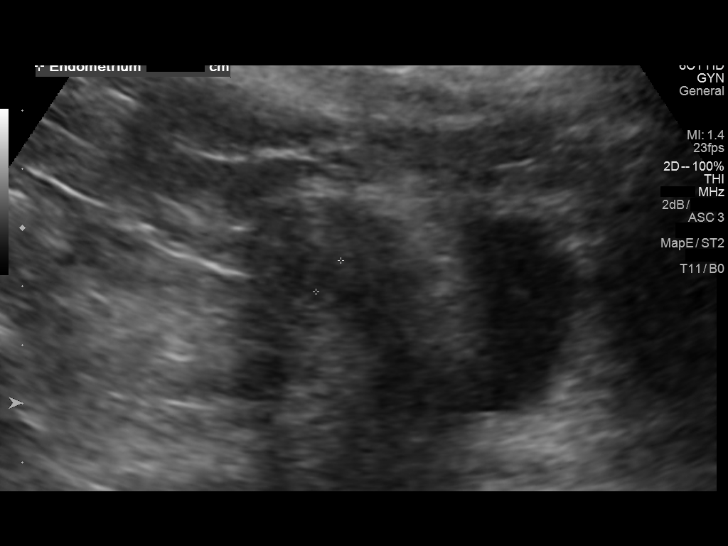
[im 23/91]
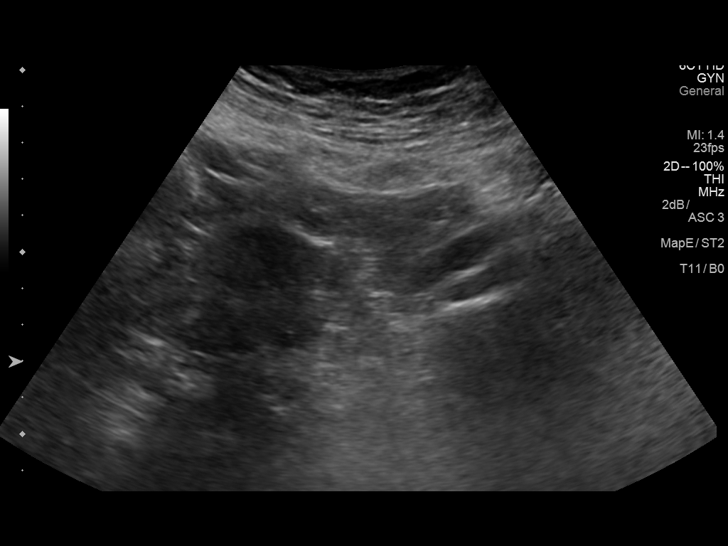
[im 31/91]
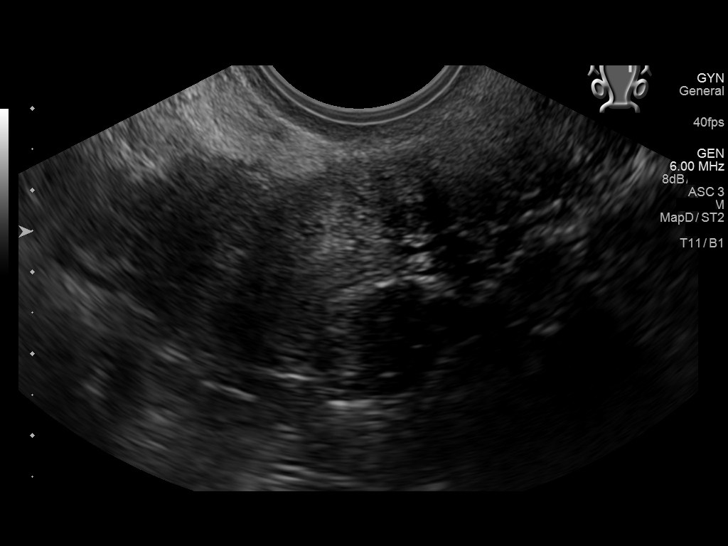
[im 34/91]
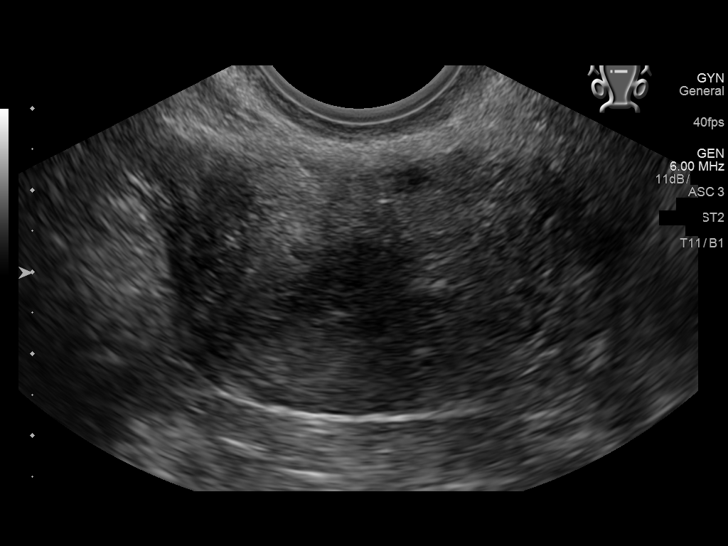
[im 42/91]
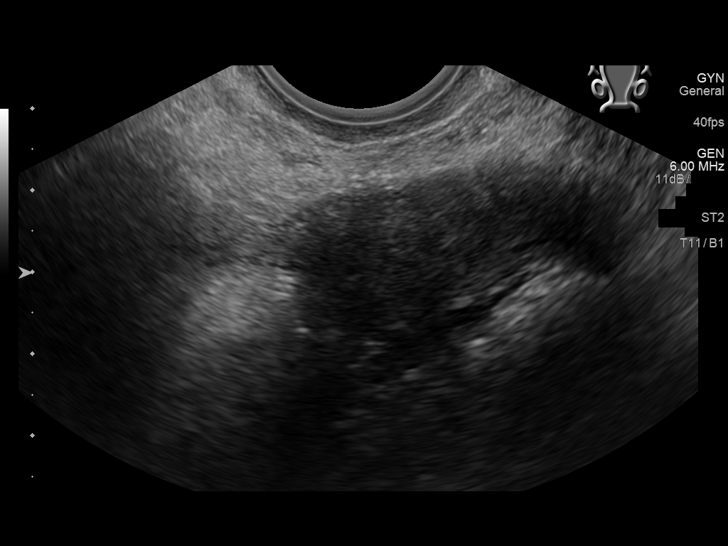
[im 49/91]
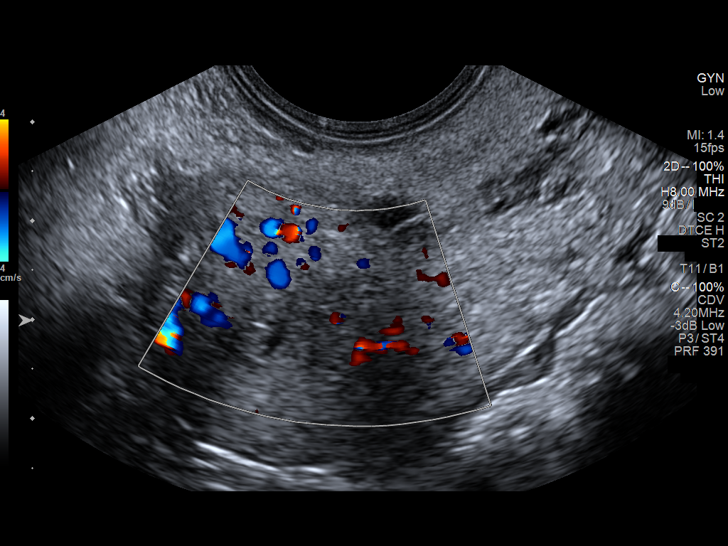
[im 57/91]
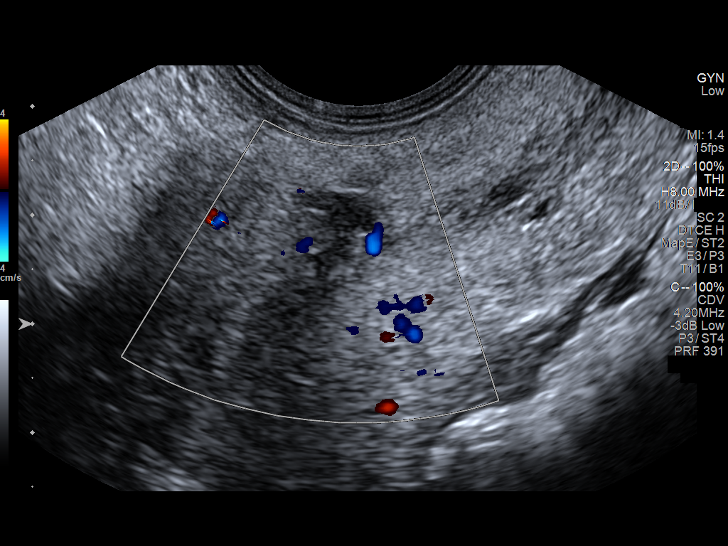
[im 61/91]
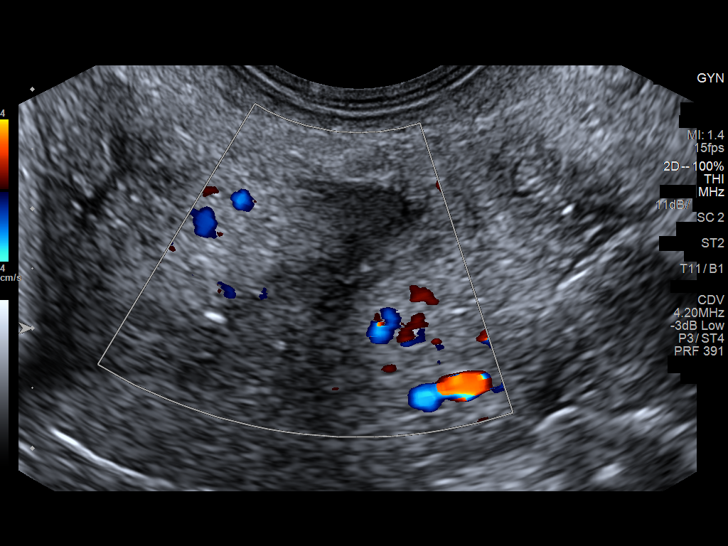
[im 68/91]
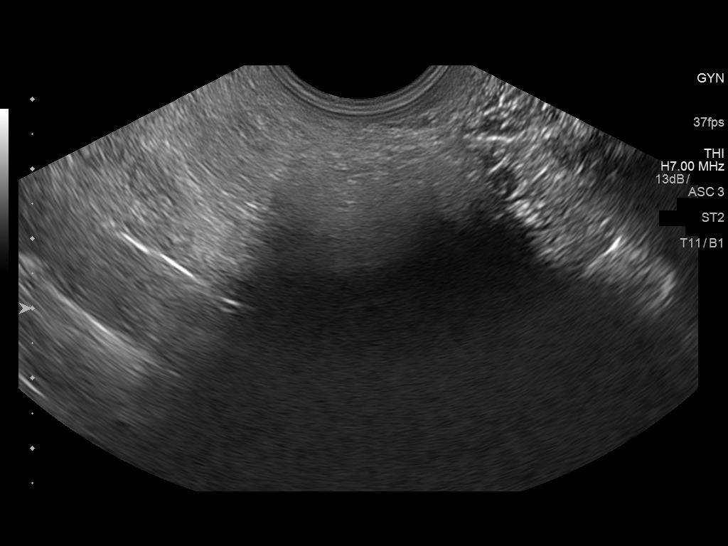
[im 76/91]
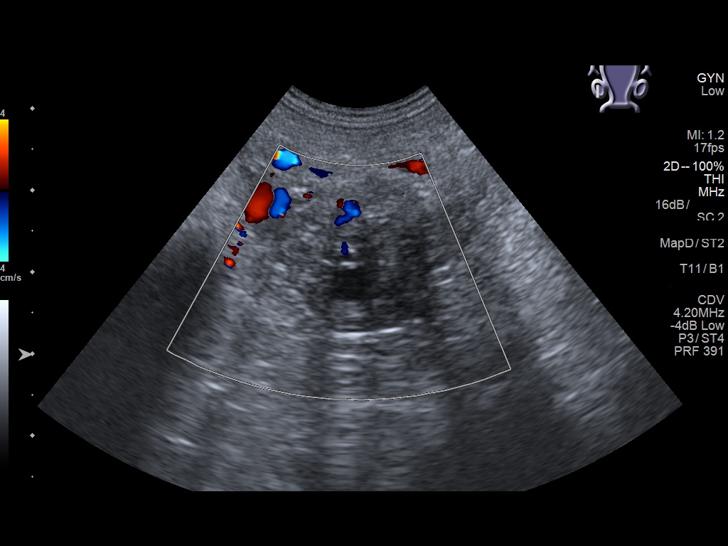
[im 83/91]
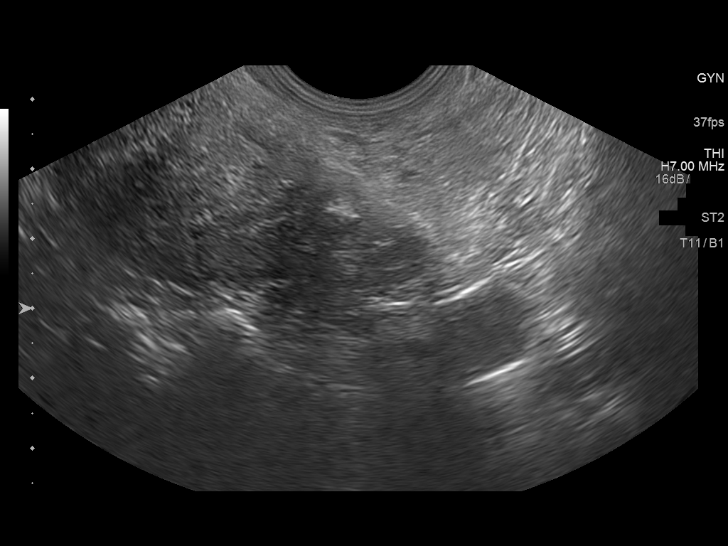
[im 91/91]
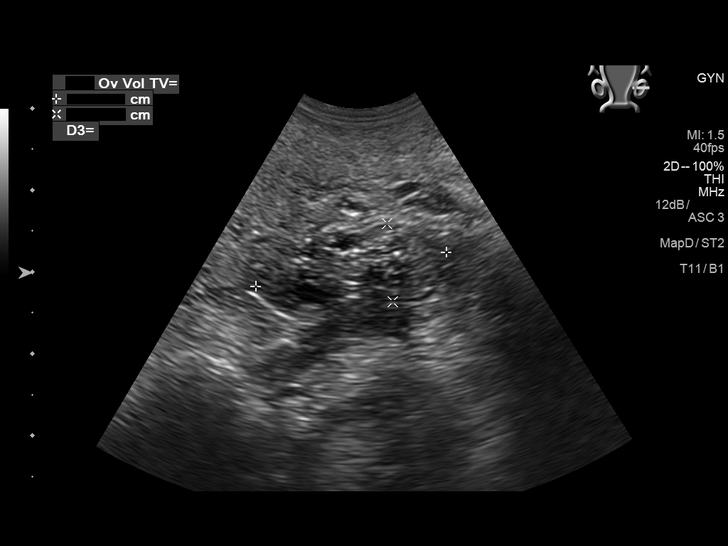

[14 of 25 positions shown; findings below may reference images not displayed]

FINDINGS: Uterus

Measurements: 6.7 x 2.9 x 4.6 cm. Suspected C-section scar along the
anterior uterine body.

Endometrium

Thickness: 4 mm.  No focal abnormality visualized.

Right ovary

Measurements: 1.6 x 1.3 x 1.8 cm. Normal appearance/no adnexal mass.

Left ovary

Measurements: 2.4 x 1.0 x 1.7 cm. Normal appearance/no adnexal mass.

Other findings

No abnormal free fluid.
IMPRESSION: Unremarkable pelvic ultrasound.
